# Patient Record
Sex: Male | Born: 1950 | Race: White | Hispanic: No | Smoking: Former smoker
Health system: Southern US, Community
[De-identification: ages and names within clinical notes are randomized; demographics above are authoritative.]

## PROBLEM LIST (undated history)

## (undated) DIAGNOSIS — K222 Esophageal obstruction: Secondary | ICD-10-CM

## (undated) DIAGNOSIS — D494 Neoplasm of unspecified behavior of bladder: Secondary | ICD-10-CM

## (undated) DIAGNOSIS — I639 Cerebral infarction, unspecified: Secondary | ICD-10-CM

## (undated) DIAGNOSIS — R1310 Dysphagia, unspecified: Secondary | ICD-10-CM

## (undated) DIAGNOSIS — I10 Essential (primary) hypertension: Secondary | ICD-10-CM

## (undated) DIAGNOSIS — E119 Type 2 diabetes mellitus without complications: Secondary | ICD-10-CM

## (undated) DIAGNOSIS — K635 Polyp of colon: Secondary | ICD-10-CM

## (undated) DIAGNOSIS — K219 Gastro-esophageal reflux disease without esophagitis: Secondary | ICD-10-CM

## (undated) MED FILL — Lidocaine HCl Urethral/Mucosal Gel Prefilled Syringe 2%: CUTANEOUS | Qty: 1 | Status: AC

---
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
## 2007-03-15 DIAGNOSIS — I639 Cerebral infarction, unspecified: Secondary | ICD-10-CM

## 2007-03-15 HISTORY — DX: Cerebral infarction, unspecified: I63.9

## 2007-08-26 ENCOUNTER — Other Ambulatory Visit: Payer: Self-pay

## 2007-08-26 ENCOUNTER — Emergency Department: Payer: Self-pay | Admitting: Emergency Medicine

## 2007-09-17 ENCOUNTER — Other Ambulatory Visit: Payer: Self-pay

## 2007-09-17 ENCOUNTER — Inpatient Hospital Stay: Payer: Self-pay | Admitting: *Deleted

## 2007-09-21 ENCOUNTER — Ambulatory Visit: Payer: Self-pay | Admitting: Physical Medicine & Rehabilitation

## 2007-09-21 ENCOUNTER — Inpatient Hospital Stay (HOSPITAL_COMMUNITY)
Admission: RE | Admit: 2007-09-21 | Discharge: 2007-09-29 | Payer: Self-pay | Admitting: Physical Medicine & Rehabilitation

## 2007-10-02 ENCOUNTER — Encounter: Payer: Self-pay | Admitting: Physical Medicine & Rehabilitation

## 2007-10-13 ENCOUNTER — Encounter: Payer: Self-pay | Admitting: Physical Medicine & Rehabilitation

## 2007-10-18 ENCOUNTER — Inpatient Hospital Stay: Payer: Self-pay | Admitting: Internal Medicine

## 2007-11-08 ENCOUNTER — Encounter
Admission: RE | Admit: 2007-11-08 | Discharge: 2007-11-09 | Payer: Self-pay | Admitting: Physical Medicine & Rehabilitation

## 2007-11-08 ENCOUNTER — Ambulatory Visit: Payer: Self-pay | Admitting: Gastroenterology

## 2007-11-09 ENCOUNTER — Ambulatory Visit: Payer: Self-pay | Admitting: Physical Medicine & Rehabilitation

## 2007-12-06 ENCOUNTER — Emergency Department: Payer: Self-pay | Admitting: Emergency Medicine

## 2007-12-20 ENCOUNTER — Ambulatory Visit: Payer: Self-pay | Admitting: Gastroenterology

## 2009-07-05 IMAGING — US US EXTREM LOW VENOUS BILAT
1 series · 18 of 24 positions shown · non-contrast
Comparison: none

REASON FOR EXAM: leg swelling, occasional pain
COMMENTS:

PROCEDURE:     US  - US DOPPLER LOW EXTR BILATERAL  - December 06, 2007  [DATE]
RESULT:     Bilateral color flow Duplex Doppler reveals no evidence of deep
venous thrombosis.  Color flow analysis, Doppler analysis and compression
studies are normal.

[Series 1: us extrem low venous bilat · 18 of 38 slices shown]
[im 1/38]
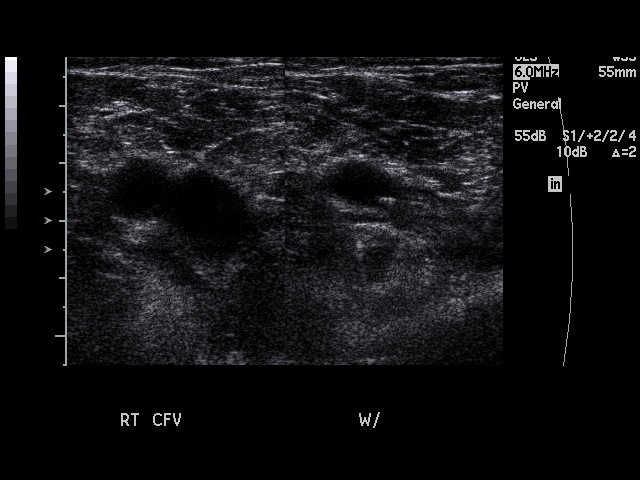
[im 4/38]
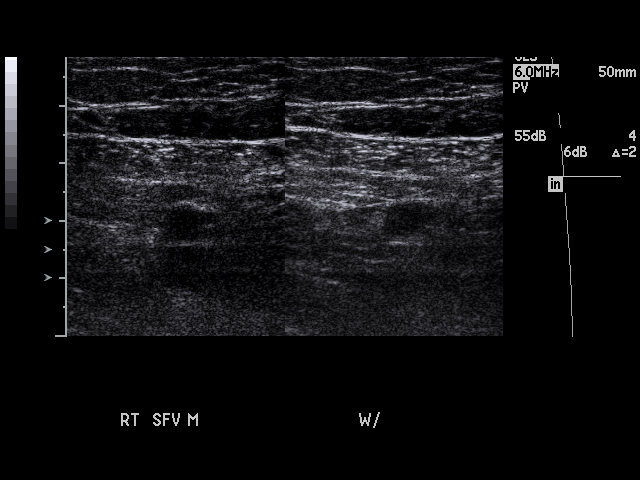
[im 5/38]
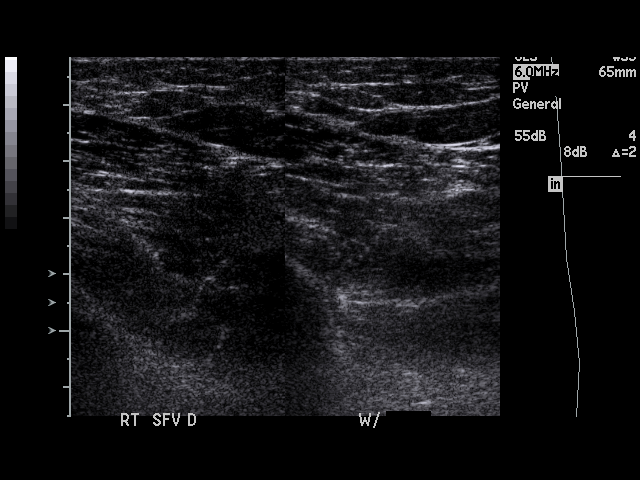
[im 7/38]
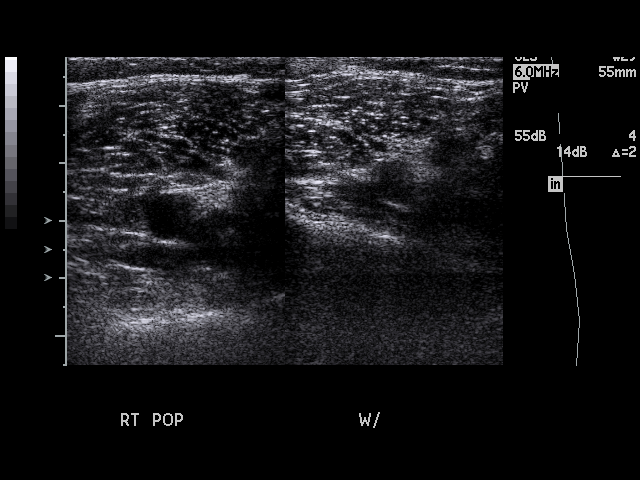
[im 10/38]
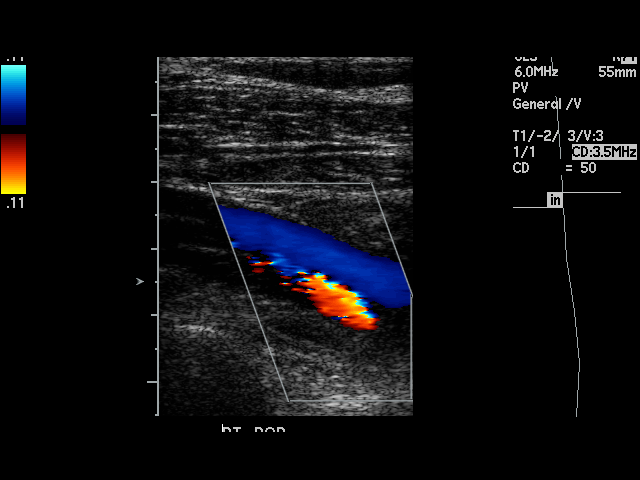
[im 12/38]
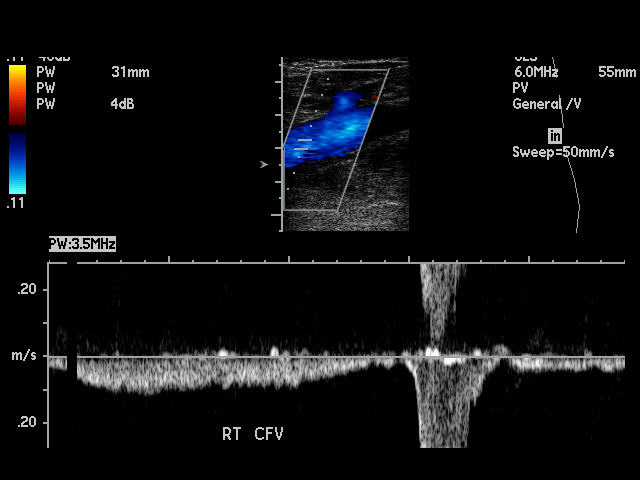
[im 13/38]
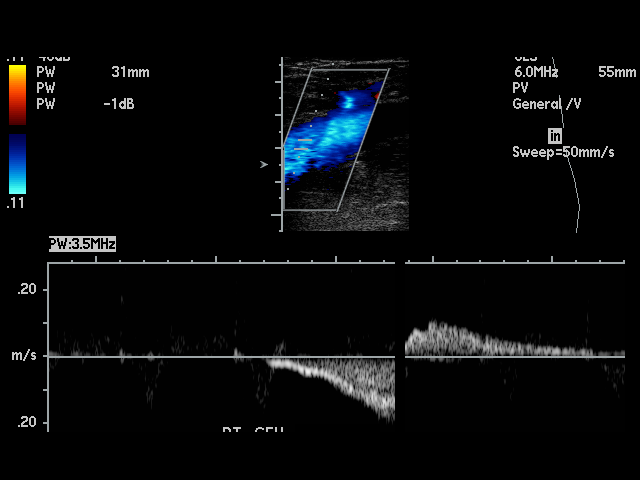
[im 17/38]
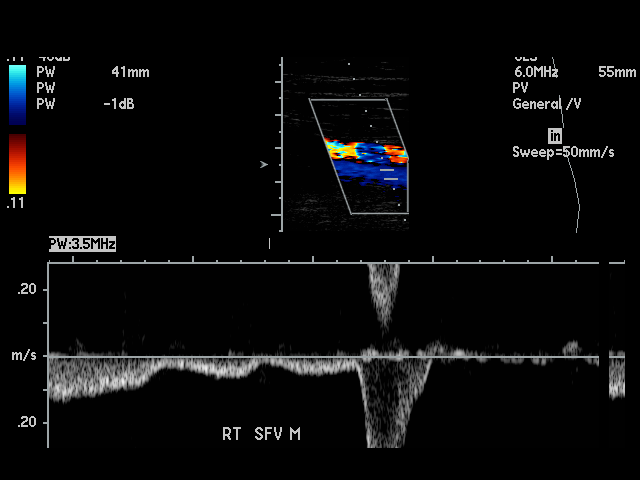
[im 18/38]
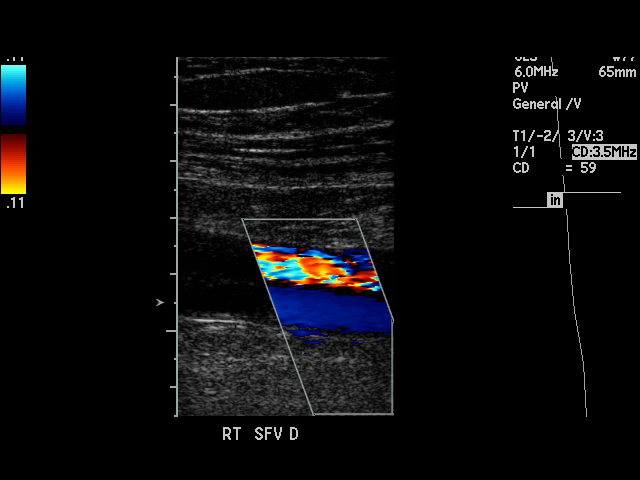
[im 20/38]
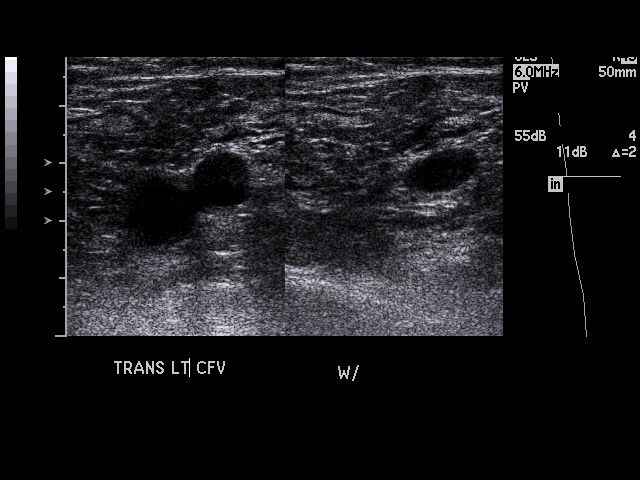
[im 23/38]
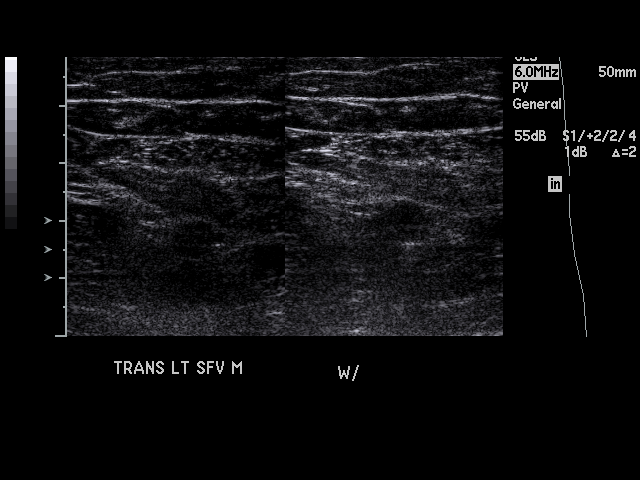
[im 25/38]
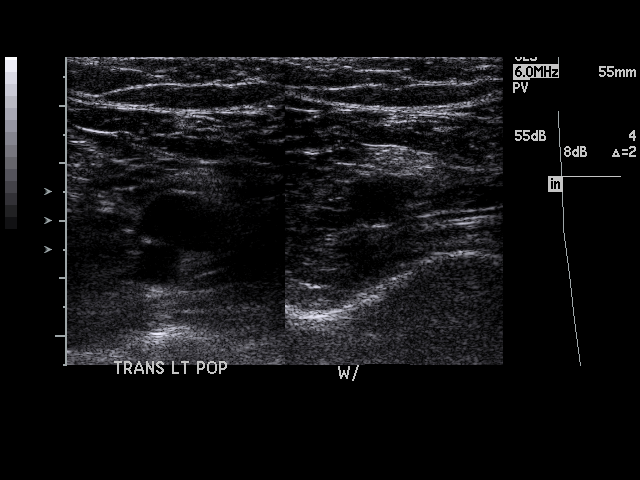
[im 26/38]
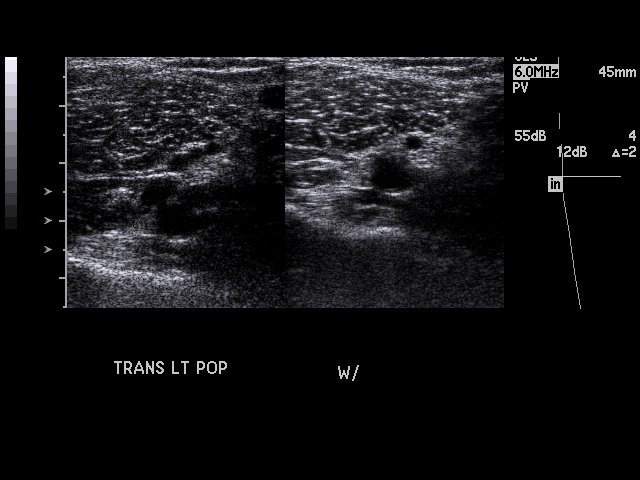
[im 29/38]
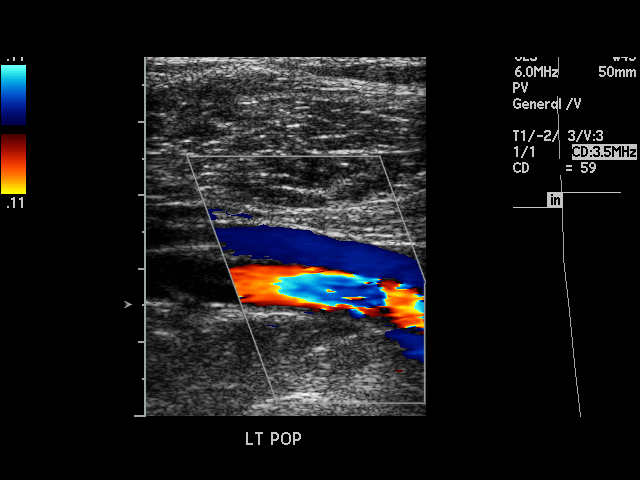
[im 31/38]
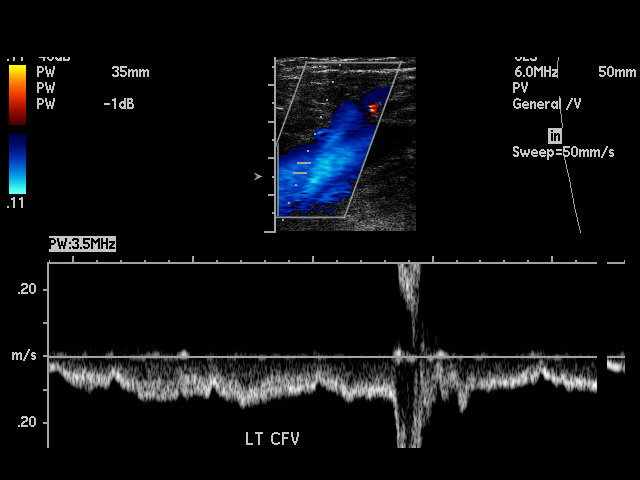
[im 33/38]
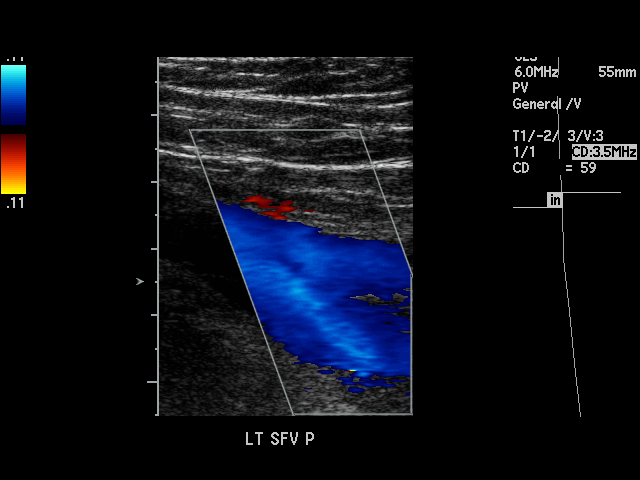
[im 36/38]
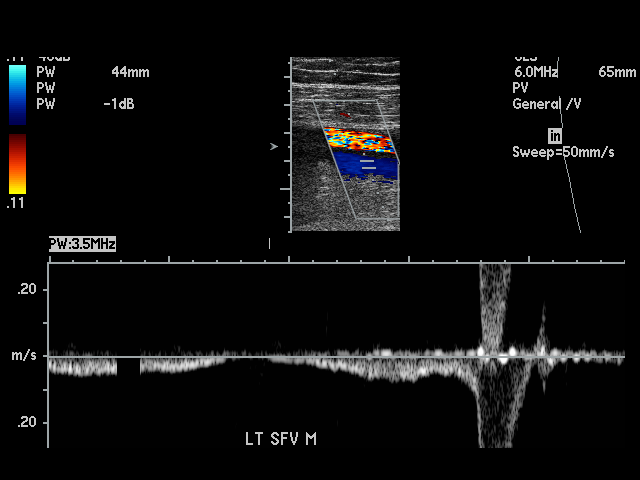
[im 38/38]
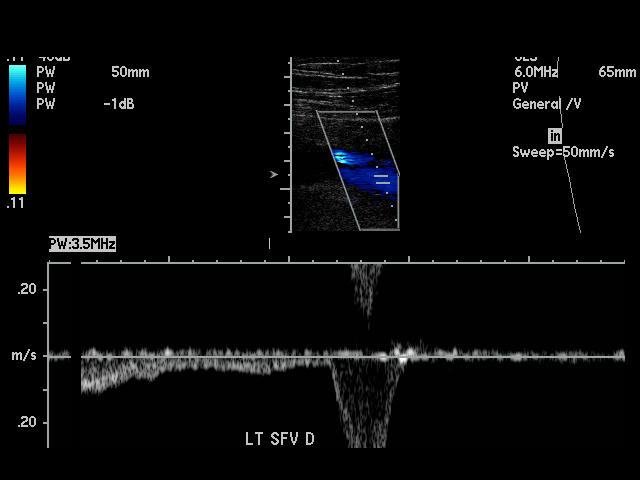

[18 of 24 positions shown; findings below may reference images not displayed]

IMPRESSION: No acute or focal abnormality is identified.

## 2009-07-05 IMAGING — CR DG CHEST 1V PORT
1 series · 1 of 1 positions shown · non-contrast
Comparison: none

REASON FOR EXAM: leg swelling
COMMENTS:

PROCEDURE:     DXR - DXR PORTABLE CHEST SINGLE VIEW  - December 06, 2007  [DATE]
RESULT:     Cardiomegaly is noted. Mild interstitial prominence is noted.
Interstitial changes are stable from 09/17/2007.  Interstitial fibrosis
cannot be excluded.

[view not recorded]
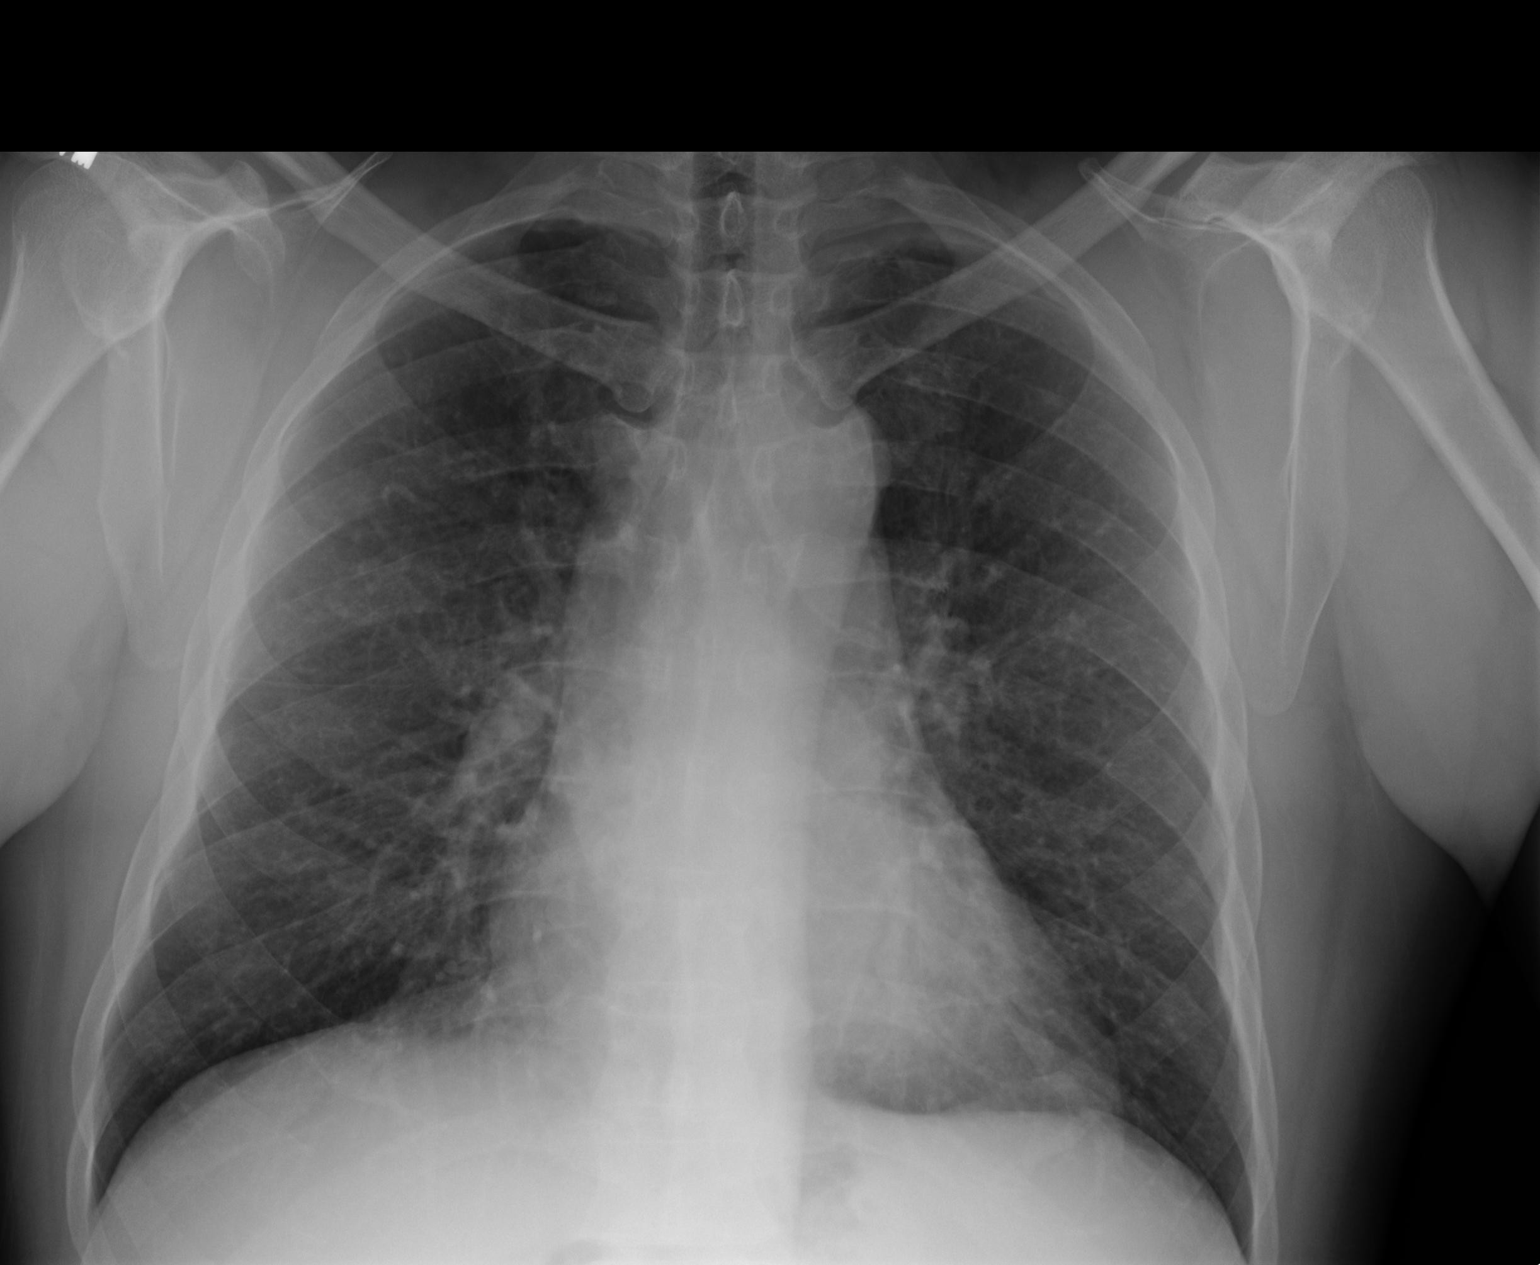

[1 of 1 positions shown; findings below may reference images not displayed]

IMPRESSION: Stable pulmonary interstitial prominence, no change from
09/17/2007.  Mild interstitial fibrosis cannot be excluded.

## 2010-01-03 ENCOUNTER — Emergency Department: Payer: Self-pay | Admitting: Emergency Medicine

## 2010-03-14 DIAGNOSIS — E785 Hyperlipidemia, unspecified: Secondary | ICD-10-CM

## 2010-03-14 HISTORY — DX: Hyperlipidemia, unspecified: E78.5

## 2010-07-09 DIAGNOSIS — I679 Cerebrovascular disease, unspecified: Secondary | ICD-10-CM

## 2010-07-09 DIAGNOSIS — E785 Hyperlipidemia, unspecified: Secondary | ICD-10-CM | POA: Insufficient documentation

## 2010-07-09 HISTORY — DX: Cerebrovascular disease, unspecified: I67.9

## 2010-07-27 NOTE — Assessment & Plan Note (Signed)
[name] returns to the clinic today for followup evaluation.  He is a  60 year old Caucasian male with a history of dizziness since September 15, 2007.  He also experienced nausea along with right-sided weakness and  numbness.  He was admitted to [facility] on September 20, 2007, for  workup revealing lacunar infarcts within the cerebellar vermis and few  subacute infarct in the right lobe of the cerebellum with focal areas of  acute infarction.  Carotid Dopplers showed mild calcified plaque.  There  was no internal carotid artery stenosis.  CT of the neck showed  degenerative changes of the cervical spine with severe disk disease.  There was a normal vertebral basilar system with a dominant left  vertebral artery.  The patient was evaluated by Neurology and they  recommend Coumadin x6 months' duration along with the aspirin and then  reevaluation at 6 months' duration.   The patient eventually stabilized.  He was moved to the rehabilitation  unit at [facility] on September 21, 2007, remained there through  discharge on September 28, 2007.  On discharge, we had him taking Coumadin 2  mg to be taken 1-1/2 tablet p.m. the Saturday that he discharged and  then hold it on Sunday with followup blood work on Monday.  He  apparently started seeing a physician early that week and apparently  they increased his dose of Coumadin up to 5 mg daily.  They apparently  did not check any blood work and the patient had significantly elevated  INR of 15.0 several days later when he eventually was seen in the  emergency department with stomachache pains.  He was found to have  bleeding in his abdomen and underwent endoscopy along with possible  barium swallow and colonoscopy.  Those have all stabilized and he  continues to take probiotics at the present time.  They were still  planning a future colonoscopy as the study that they had attempted could  not be completed at that time.  The patient is taken off  Coumadin  completely at this time but continues on aspirin therapy daily.   The patient would like to wean off some of his other medicines that he  is on at present.  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   MEDICATIONS:  1. Celexa 10 mg.  2. Aspirin 81 mg.  3. Zocor 40 mg.  4. Trazodone 50 mg nightly.  5. Protonix 40 mg daily.   REVIEW OF SYSTEMS:  Noncontributory.   PHYSICAL EXAMINATION:  GENERAL:  Well-appearing fit adult male in mild-  to-no acute discomfort.  VITAL SIGNS:  Blood pressure 168/91 with pulse 78, respiratory rate 18,  and O2 saturation 98% on room air.  EXTREMITIES:  The patient has 5-/5 strength throughout the bilateral  upper and lower extremities.  Bulk and tone were normal, reflexes were  2+ and symmetrical.  The patient ambulates without any assistive device.   IMPRESSION:  1. Right occipital ischemic stroke with ataxia, balance deficits,      dizziness, and gaze instability.  2. Dyslipidemia.  3. Anxiety with situational adjustment reaction.  4. Borderline hypertension.   In the office today, we did discuss the problems that he had with his  primary care physician and the severely elevated INR on high doses of  Coumadin.  He was on a much lower dose in the hospital and was  discharged  on a much lower dose.  Unfortunately, he did not have regular  blood work done once the dose was adjusted as an outpatient.  At this  point, the patient is off Coumadin but remains on aspirin therapy.  He  has improved and has actually completed all therapy at this point.  He  is anxious to return to his work and return to driving.  We have given  him a slip that he can do at as of November 12, 2007.  In terms of his  medicine, we have made the trazodone nightly p.r.n. and asked him to  wean the Celexa to every other day for 1 week and then discontinue  completely.  He will remain on aspirin, Zocor, and  Protonix medications  with his new primary care to make further adjustments there.  He would  like to come off the cholesterol medication and will need blood work to  verify that his levels were under control.  It does not appear that any  blood work was done for lipid levels in the hospital.   We will plan on seeing the patient in followup on an as-needed basis,  and he will be following up with his primary care physician.           ______________________________  Ellwood Dense, M.D.     DC/MedQ  D:  11/09/2007 13:15:45  T:  11/10/2007 03:36:04  Job #:  [id number]

## 2010-07-27 NOTE — Discharge Summary (Signed)
NAMEAMARE, BAIL NO.:  [id number]   MEDICAL RECORD NO.:  [id number]          PATIENT TYPE:  IPS   LOCATION:  4033                         FACILITY:  [facility]   PHYSICIAN:  Ellwood Dense, M.D.   DATE OF BIRTH:  Dec 07, 1950   DATE OF ADMISSION:  09/21/2007  DATE OF DISCHARGE:  09/28/2007                               DISCHARGE SUMMARY   DISCHARGE DIAGNOSES:  1. Right occipital cerebrovascular accident with ataxia, balance      deficits, dizziness, and gaze instability.  2. Hypertension.  3. Dyslipidemia.  4. Anxiety with situational adjustment reaction.   HISTORY OF PRESENT ILLNESS:  Caleb Davies is a 60 year old male with a  history of dizziness since September 15, 2007, as well as nausea with loss of  balance and right-sided numbness.  He was admitted to [facility] on September 20, 2007, with workup revealing lacunar infarcts within  cerebellar vermis and diffuse subacute infarct in right lobe of  cerebellum with focal areas of acute infarct.  Carotid Doppler showed  mild calcific plaque.  No ICA stenosis.  CT of neck showed degenerative  changes of C-spine with severe disk disease.  Normal vertebrobasilar  system with dominant left vertebral artery.  Neuro evaluated the patient  and recommended Coumadin x6 months with aspirin as the patient with  history of dizziness for 3 weeks and recurrent strokes.  Therapies were  initiated and the patient is requiring assist for ADLs and requiring  balance issues with impairments in mobility.  MD and therapist felt the  patient would benefit from CIR and the patient was transferred to Rehab  at [facility].   PAST MEDICAL HISTORY:  Significant for hypertension, which is a recent  diagnosis, dyslipidemia, and dizziness on and off since August 26, 2007.   ALLERGIES:  CODEINE.   FAMILY HISTORY:  Positive for coronary artery disease and CVA.   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  He has a 2-pack-per-day tobacco  use history of 40 years.  Drinks an alcoholic drink a month.  Family is  to provide supervision past discharge.   HOSPITAL COURSE:  [name] was admitted to Rehab on September 21, 2007, for inpatient therapies to consist of PT/OT daily as well as  speech therapy evaluation.  The patient underwent at least 3 hours of  therapy 5 days a week during his stay here.  Nursing has been working  with the patient regarding bowel and bladder issues as well as routine  skin care monitoring for check of breakdown.  The patient reported  elevated anxiety levels with multiple concerns due to family financial  issues at the time of admission.  He was started on Celexa 10 mg p.o.  per day for this.  Dr. Leonides Cave, Neuropsych, was also consulted for input  and followup for coping.  He felt the patient with adjustment reaction  and he has followed the  patient along during this stay.  At the time of  discharge, the patient's mood was again discussed with him and Celexa  dosage was reexamined.  The patient currently feels like his anxiety  levels are much improved and we will prefer to leave Celexa at 10 mg a  day.  The patient's blood pressures have been checked on a b.i.d. basis  during this stay.  Low-dose Altace was added secondary to a history of  stroke.  However, the patient has been unable to tolerate this.  His  blood pressure is dropping down in 90s to low 100 range.  Altace has  been on hold during the stay.  Blood pressures at the time of discharge  ranging from 100-110 systolic and 60s diastolic.  The patient has had  issues with dizziness initially.  He was started on meclizine 25 mg  q.i.d. that has helped with his symptoms.  PT also completed a  vestibular evaluation that revealed that the patient had gaze  instability with no oculomotor  deficits.  Gaze stabilization exercises  as well as visual convergence exercises were given to the patient and he  is to continue doing this as well as somatosensory training to help with  his symptomatology.  Pharmacy has been following along and adjusting the  patient's Coumadin during this stay.  The patient's PT/INR at the time  of discharge on September 28, 2007, reveals PT at 33.9 and INR at 3.1.  The  patient's INR has been on an upward trend.  He is discharged on 1 mg  alternating with two every other day for now.  He will have a repeat  protime checked at Dr. Chancy Milroy office on October 01, 2007, and Dr.  Rito Ehrlich to further adjust Coumadin dose as needed.   Labs were done past admission including CBC revealing hemoglobin 12.3,  hematocrit 35.2, white count 5.98, and platelets 195.  Check of lytes  revealed sodium 138, potassium 3.8, chloride 105, CO2 25, BUN 9,  creatinine 0.9, and glucose 109.  LFTs revealed T-bili 1.2, alkaline  phos 54, SGOT 14, SGPT 21, total protein 5.8, and albumin 3.1.  The  patient is maintained on Zocor for his dyslipidemia.  He will need a  recheck of LFTs in 3 months.  Hemoglobin A1c was also checked and this  was noted to be at 5.5 range.  The patient shows some evidence of  impaired blood sugars with fasting blood sugars ranging at 100-120  range.  He will need followup on his blood sugars past discharge.  The  patient does have a history of tobacco abuse.  He reports that he does  not require any assistance in tobacco cessation.  He plans on quitting  smoking.  The patient's mood during this stay has been stable overall.  He has been motivated and has participated along well.  In PT, the  patient was initially at max assist due to balance deficits, but has  progressed along to being at modified independent in a supervised  setting with the use of rolling walker.  He requires min assist to  ambulate without rolling walker.  OT has been working with the  patient  with ADL functioning on transfers, visual movements, and head movements  with sitting and standing.  Initially, the patient requires steady  assist for balance with min-to-mod assist for self care.  The patient  has progressed along to being at supervision to modified independent  goal for self care.  Speech Therapy  evaluated the patient and felt the  patient with intact receptive expressive language skills with some  decreased breath support due to his history of tobacco abuse.  He was  modified independent for higher-level functional cognitive test  additionally.  No speech therapy needs were warranted at this stay.  The  patient will continue with further followup outpatient PT/OT at [facility] beginning on October 02, 2007.   DISCHARGE MEDICATIONS:  1. Coated aspirin 81 mg a day.  2. Zocor 40 mg in the evening.  3. Dulcolax tabs 2 pills per day at bedtime.  4. Celexa 10 mg a day.  5. Trazodone 50 mg nightly.  6. Antivert 25 mg at 7 a.m., noon, 4 p.m., and 10 p.m.  7. Coumadin 2 mg half  p.o. in p.m. on Saturday, hold p.o. in p.m.      Sunday, further dosing per Dr. Rito Ehrlich.  8. Protonix 40 mg a day.  9. Milk of magnesia 30 mL p.r.n. if no BM in 2-3 days.   DIET:  Heart healthy, high fiber.   ACTIVITY:  Intermittent supervision.  No strenuous activity.  No  alcohol, no smoking, and no driving.  Walk with supervision.   FOLLOWUP:  The patient is to follow up with Dr. Thomasena Edis in 6 weeks.  Follow up with Dr. Rito Ehrlich on Monday at 7:20 to have labs drawn.  Follow up with Neurology and Dr. Elenore Rota as needed.      Greg Cutter, P.A.    ______________________________  Ellwood Dense, M.D.    PP/MEDQ  D:  09/28/2007  T:  09/29/2007  Job:  [id number]   cc:   Dr. Rito Ehrlich

## 2010-07-27 NOTE — H&P (Signed)
NAMEJAYVAN, MCSHAN NO.:  [id number]   MEDICAL RECORD NO.:  [id number]          PATIENT TYPE:  IPS   LOCATION:  4033                         FACILITY:  [facility]   PHYSICIAN:  Ellwood Dense, M.D.   DATE OF BIRTH:  05/04/1950   DATE OF ADMISSION:  09/21/2007  DATE OF DISCHARGE:                              HISTORY & PHYSICAL   PRIMARY CARE PHYSICIAN:  Steele Sizer, MD   HOSPITALIST:  [facility], Dr. Kyra Manges.   HISTORY OF PRESENT ILLNESS:  Mr. Pelaez is a 60 year old Caucasian male  with history of dizziness since approximately September 15, 2007.  He has also  had nausea and loss of balance with right-sided numbness noted.   The patient initially had symptoms back in June, and that was evaluated  and felt to be related to vertigo and possible inner ear problems.  He  is given meclizine and Phenergan, but did not have resolution of his  symptoms, and actually they worsened in early July.   The patient was admitted to [facility] on September 20, 2007,  for workup.  MRI study of the brain showed a lacunar infarct within the  cerebellar vermis with diffuse subacute infarcts within the right lobe  at the cerebellum with focal areas of acute infarct.  Carotid Doppler  showed mild calcified plaque of the internal carotid artery system.  CT  angiography of the neck showed degenerative changes in the cervical  spine with severe degenerative disk disease with normal vertebral-  basilar system with dominant left vertebral artery.   Neurology was consulted for recommendations, and they suggested Coumadin  for 6 months.  At that time, he would be reevaluated and if he has  stroke symptoms, he would be continued on Coumadin.  If he had no stroke  symptoms, they recommended switching him to aspirin at that point.   Therapies have been initiated, and the patient requires assist for ADLs  and mobility with balance issues.   A 2-D echocardiogram showed an  ejection fraction of greater than equal  to 55% without any source of emboli noted.   The patient was evaluated by the rehabilitation physicians, and felt to  be an appropriate candidate for inpatient rehabilitation.   REVIEW OF SYSTEMS:  Positive for high levels of stress related to his  job, dizziness with hiccups since initial stroke symptoms.   PAST MEDICAL HISTORY:  1. History of hypertension with recurrent diagnoses of such.  2. Dyslipidemia.  3. History of dizziness off and on since August 26, 2007.   FAMILY HISTORY:  Positive for coronary artery disease and stroke.   SOCIAL HISTORY:  The patient has his youngest daughter living with him  in a single white trailer with 3 steps to enter.  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  He  has 4 biological daughters and 2 biological sons with 2 females, who he  is both divorced from.  He has good relationship with his most  recent  wife, who is in the room with him today.  The patient smokes 2 pack of  cigarettes per day for 40 years and reports an alcoholic drink  approximately monthly.   FUNCTIONAL HISTORY PRIOR TO ADMISSION:  Independent, working, and  driving prior to admission.   ALLERGIES:  CODEINE.   MEDICATIONS PRIOR TO ADMISSION:  1. Recent blood pressure medication.  2. Phenergan p.r.n.  3. Meclizine p.r.n.   LABORATORY DATA:  Recent hemoglobin was 14.6 with hematocrit of 41.6,  platelet count of 292,000, and white count of 8.2.  Recent total  cholesterol was 222 with HDL cholesterol of 29, LDL cholesterol of 172,  and triglycerides of 105.  Chest x-ray on September 17, 2007, showed clear  lung fields without any active disease.  INR today was 1.0 with  hemoglobin repeat of 11.9.  Most recent sodium was 139, potassium 3.9,  chloride 104, bicarbonate 26, BUN 11, creatinine 1.14 with glucose of  149, and  recent liver function tests were within functional limits.   PHYSICAL EXAMINATION:  GENERAL:  Well-appearing, well-developed, and  well-nourished adult male, lying in bed, in no acute discomfort.  VITAL SIGNS:  Blood pressure 141/74 with pulse of 72, respiratory rate  20, and temperature 97.2.  HEENT:  Normocephalic and nonatraumatic.  CARDIOVASCULAR:  Regular rate and rhythm.  S1 and S2 without murmurs.  ABDOMEN:  Soft and nontender with positive bowel sounds.  LUNGS:  Clear to auscultation bilaterally.  NEUROLOGIC:  Alert and oriented x3.  Cranial nerves II through XII were  intact.  EXTREMITIES:  The patient has 5/5 strengths throughout bilateral upper  extremities with slight decrease coordinated movements.  Lower extremity  exam showed 5/5 strength in hip flexion, knee extension, and ankle  dorsiflexion.  Sensation was intact to light touch with bilateral lower  extremities.  The patient has slightly discoordinated heel-to-shin  movements bilaterally.   DIAGNOSES:  1. Status post right occipital stroke with ataxia, balance, dizziness,      and hiccups.  2. Presently, the patient has deficits in ADLs, transfers, and      ambulation related to the above-noted right occipital stroke.   PLAN:  1. Admit to the rehabilitation unit for daily therapies to include      physical therapy for range of motion, strengthening, bed mobility,      transfers, pre-gait training, gait training, and equipment      evaluation.  2. Occupational therapy for range of motion, strengthening, ADLs,      cognitive/perceptual training, splinting, and equipment evaluation.  3. Rehab nursing for skin care, wound care, and bowel and bladder      training if necessary.  4. Case management to assess home environment, assist with discharge      planning, and arrange for appropriate followup care.  5. Social work to assess family and social support and assist in      discharge planning.  6. Speech therapy to  evaluate higher level cognition and swallow if      necessary.  7. Monitor of hypertension with adjustment of medicines as needed.  8. Continue Zocor for dyslipidemia.  9. Check admission labs including CBC and CMET in a.m. on September 24, 2007.  10.Regular diet.  11.Enteric-coated aspirin 81 mg p.o. daily.  12.Zocor 40 mg p.o. daily.  13.Protonix 40 mg p.o. daily.  14.Coumadin, per pharmacy.  15.Meclizine 25 mg p.o. q.i.d. p.r.n. for dizziness.  16.Baclofen 5 mg p.o. t.i.d. for  hiccups.  17.Senokot-S 2 tablets p.o. nightly.  18.Dulcolax suppository 1 per rectum today if no bowel movement by 4      p.m.  19.Discontinue normal saline lock in the right upper extremity.  20.Celexa 10 mg p.o. daily.  21.One-on-one counseling with Dr. Leonides Cave for coping and stress relief      on September 25, 2007.  22.Altace 2.5 mg p.o. daily.   Presently, the plan is to continue therapies as noted above with weekly  conference meetings and other meetings as necessary to address the  present status, goals, and barriers to discharge.   PROGNOSIS:  Good.   ESTIMATED LENGTH OF STAY:  5-10 days.   GOALS:  Modified independent, ADLs, transfers, and ambulation at least  household distances.           ______________________________  Ellwood Dense, M.D.     DC/MEDQ  D:  09/21/2007  T:  09/22/2007  Job:  [id number]

## 2010-12-09 LAB — HEMOGLOBIN A1C
Hgb A1c MFr Bld: 5.5
Mean Plasma Glucose: 119

## 2010-12-09 LAB — COMPREHENSIVE METABOLIC PANEL
Albumin: 3.1 — ABNORMAL LOW
Alkaline Phosphatase: 54
BUN: 9
CO2: 25
Chloride: 105
Creatinine, Ser: 0.98
GFR calc non Af Amer: >60
Glucose, Bld: 109 — ABNORMAL HIGH
Potassium: 3.8
Total Bilirubin: 1.2

## 2010-12-09 LAB — CBC
HCT: 35.2 — ABNORMAL LOW
Hemoglobin: 12.3 — ABNORMAL LOW
MCV: 93.6
Platelets: 195
WBC: 5.9

## 2010-12-09 LAB — DIFFERENTIAL
Basophils Absolute: 0
Basophils Relative: 0
Lymphocytes Relative: 26
Monocytes Absolute: 0.5
Neutro Abs: 3.4
Neutrophils Relative %: 58

## 2010-12-09 LAB — URINALYSIS, ROUTINE W REFLEX MICROSCOPIC
Glucose, UA: NEGATIVE
Nitrite: NEGATIVE
Protein, ur: NEGATIVE
pH: 7

## 2010-12-09 LAB — PROTIME-INR
INR: 1.1
INR: 1.5
INR: 2.7 — ABNORMAL HIGH
INR: 3 — ABNORMAL HIGH
Prothrombin Time: 33.5 — ABNORMAL HIGH

## 2010-12-09 LAB — URINE CULTURE

## 2010-12-10 LAB — PROTIME-INR
INR: 2.3 — ABNORMAL HIGH
INR: 3 — ABNORMAL HIGH
INR: 3.1 — ABNORMAL HIGH
Prothrombin Time: 33.5 — ABNORMAL HIGH
Prothrombin Time: 33.9 — ABNORMAL HIGH

## 2011-04-12 DIAGNOSIS — I10 Essential (primary) hypertension: Secondary | ICD-10-CM | POA: Insufficient documentation

## 2011-04-12 DIAGNOSIS — E119 Type 2 diabetes mellitus without complications: Secondary | ICD-10-CM | POA: Insufficient documentation

## 2012-03-14 DIAGNOSIS — N529 Male erectile dysfunction, unspecified: Secondary | ICD-10-CM

## 2012-03-14 HISTORY — DX: Male erectile dysfunction, unspecified: N52.9

## 2012-03-14 HISTORY — PX: ANTERIOR CERVICAL DECOMP/DISCECTOMY FUSION: SHX1161

## 2012-08-27 DIAGNOSIS — Z0000 Encounter for general adult medical examination without abnormal findings: Secondary | ICD-10-CM | POA: Insufficient documentation

## 2012-08-27 DIAGNOSIS — N529 Male erectile dysfunction, unspecified: Secondary | ICD-10-CM | POA: Insufficient documentation

## 2012-08-27 DIAGNOSIS — K219 Gastro-esophageal reflux disease without esophagitis: Secondary | ICD-10-CM | POA: Insufficient documentation

## 2012-10-16 DIAGNOSIS — M25519 Pain in unspecified shoulder: Secondary | ICD-10-CM | POA: Insufficient documentation

## 2012-10-31 DIAGNOSIS — M5020 Other cervical disc displacement, unspecified cervical region: Secondary | ICD-10-CM

## 2012-10-31 HISTORY — DX: Other cervical disc displacement, unspecified cervical region: M50.20

## 2013-08-16 LAB — CBC WITH DIFFERENTIAL/PLATELET
BASOS PCT: 0.8 %
Basophil #: 0 10*3/uL (ref 0.0–0.1)
EOS PCT: 4.2 %
Eosinophil #: 0.2 10*3/uL (ref 0.0–0.7)
HCT: 37.5 % — AB (ref 40.0–52.0)
HGB: 13 g/dL (ref 13.0–18.0)
LYMPHS ABS: 1.4 10*3/uL (ref 1.0–3.6)
Lymphocyte %: 25.1 %
MCH: 31.5 pg (ref 26.0–34.0)
MCHC: 34.6 g/dL (ref 32.0–36.0)
MCV: 91 fL (ref 80–100)
Monocyte #: 0.6 x10 3/mm (ref 0.2–1.0)
Monocyte %: 9.9 %
NEUTROS PCT: 60 %
Neutrophil #: 3.3 10*3/uL (ref 1.4–6.5)
PLATELETS: 206 10*3/uL (ref 150–440)
RBC: 4.13 10*6/uL — ABNORMAL LOW (ref 4.40–5.90)
RDW: 14.1 % (ref 11.5–14.5)
WBC: 5.5 10*3/uL (ref 3.8–10.6)

## 2013-08-16 LAB — COMPREHENSIVE METABOLIC PANEL
ALBUMIN: 3.5 g/dL (ref 3.4–5.0)
ALT: 28 U/L (ref 12–78)
Alkaline Phosphatase: 103 U/L
Anion Gap: 1 — ABNORMAL LOW (ref 7–16)
BUN: 10 mg/dL (ref 7–18)
Bilirubin,Total: 2.1 mg/dL — ABNORMAL HIGH (ref 0.2–1.0)
CO2: 30 mmol/L (ref 21–32)
CREATININE: 1.26 mg/dL (ref 0.60–1.30)
Calcium, Total: 9 mg/dL (ref 8.5–10.1)
Chloride: 109 mmol/L — ABNORMAL HIGH (ref 98–107)
EGFR (African American): >60
EGFR (Non-African Amer.): >60
GLUCOSE: 99 mg/dL (ref 65–99)
Osmolality: 278 (ref 275–301)
POTASSIUM: 3.8 mmol/L (ref 3.5–5.1)
SGOT(AST): 29 U/L (ref 15–37)
SODIUM: 140 mmol/L (ref 136–145)
Total Protein: 7.3 g/dL (ref 6.4–8.2)

## 2013-08-17 ENCOUNTER — Observation Stay: Payer: Self-pay | Admitting: Student

## 2013-09-11 HISTORY — PX: ESOPHAGOGASTRODUODENOSCOPY: SHX1529

## 2013-09-11 HISTORY — PX: COLONOSCOPY: SHX174

## 2013-10-03 ENCOUNTER — Ambulatory Visit: Payer: Self-pay | Admitting: Gastroenterology

## 2014-07-05 NOTE — H&P (Signed)
PATIENT NAME:  Caleb Davies, Caleb Davies MR#:  [id number] DATE OF BIRTH:  07-May-1950  DATE OF ADMISSION:  08/17/2013  REFERRING PHYSICIAN:  Dr. Marjean Donna.  PRIMARY CARE PHYSICIAN:  Dr. Rudene Christians of [facility].   CHIEF COMPLAINT:  "Something stuck in my throat."   HISTORY OF PRESENT ILLNESS:  A 64 year old Caucasian gentleman with past medical history of hypertension, hyperlipidemia as well as CVA without residual deficit presenting with esophageal foreign body sensation.  He was eating one day prior to arrival steak, had immediate foreign body stuck sensation located in the retrosternal and around the lower esophageal sphincter.  Denies any chest pain or shortness of breath.  He was able to tolerate liquids as well as his secretions.  No difficulty talking.  He was, however, unable to tolerate any solid foods.  Thus presented to the hospital for the above symptoms.  They have remained unchanged since the initial onset approximately one day ago.  On initial work-up found to have a 3 x 2 cm foreign body lesion in the lower esophagus.   REVIEW OF SYSTEMS:   CONSTITUTIONAL:  Denies fever, fatigue, weakness.  EYES:  Denies blurred vision, double vision, eye pain. EAR, NOSE, THROAT:  Denies tinnitus, ear pain, hearing loss. RESPIRATORY:  Denies cough, wheeze, shortness of breath. CARDIOVASCULAR:  Denies chest pain, palpitations, edema. GASTROINTESTINAL:  Denies nausea, vomiting, diarrhea, abdominal pain.  GENITOURINARY:  Denies dysuria, hematuria. ENDOCRINE:  Denies nocturia or thyroid problems. HEMNATOLOGY AND LYMPHATIC:  Denies easy bruising or bleeding. SKIN:  Denies rashes or lesions.  MUSCULOSKELETAL:  Denies pain in the neck, back, shoulder, knees, hips or arthritic symptoms.  NEUROLOGIC:  Denies paralysis, paresthesias. PSYCHIATRIC:  Denies anxiety or depressive symptoms.  Otherwise, full review of systems performed by me is negative.   PAST MEDICAL HISTORY:  Gastroesophageal reflux disease,  history of CVA, hypertension, hyperlipidemia.   SOCIAL HISTORY:  Remote tobacco usage, occasional smokeless tobacco.  Occasional alcohol usage.   FAMILY HISTORY:  Denies any known cardiovascular or pulmonary disorders.   ALLERGIES:  CODEINE.   HOME MEDICATIONS:  Include aspirin 81 mg by mouth daily, atorvastatin unknown dosage daily, Prevacid 30 mg by mouth daily as well as an unknown hypertensive medication.   PHYSICAL EXAMINATION:  VITAL SIGNS:  Temperature 98.1, heart rate 84, respirations 18, blood pressure 161/90, saturating 93% on room air.  Weight 95.3 kg, BMI of 31.   GENERAL:  Well-nourished, well-developed, Caucasian male, currently in no acute distress. HEAD:  Normocephalic, atraumatic.   EYES:  Pupils equal, round, reactive to light.  Extraocular muscles intact.  No scleral icterus. MOUTH:  Moist mucosal membranes.  Dentition intact.  No abscess noted.   EAR, NOSE, THROAT:  Clear without exudates.  No external lesions. NECK:  Supple.  No thyromegaly.  No nodules.  No JVD.  PULMONARY:  Clear to auscultation bilaterally without wheezes, rales or rhonchi.  No use of accessory muscles.  Good respiratory effort.  Chest nontender to palpation. CARDIOVASCULAR:  S1, S2, regular rate and rhythm, no murmurs, rubs or gallops.  No edema.  Pedal pulses 2+ bilaterally.  GASTROINTESTINAL:  Soft, nontender, nondistended.  No masses.  Positive bowel sounds.  No hepatosplenomegaly.   MUSCULOSKELETAL:  No swelling, clubbing, edema.  Range of motion full in all extremities.   NEUROLOGIC:  Cranial nerves II through XII intact.  No gross focal neurological deficits.  Sensation intact.  Reflexes intact.   SKIN:  No ulceration, lesions, rash or cyanosis.  Skim warm, dry.  Turgor  intact. PSYCHIATRIC:  Mood and affect within normal limits.  The patient is awake, alert and oriented x 3.  Insight and judgment intact.    LABORATORY DATA:  CT chest performed with contrast revealing a 3 x 2 cm foreign body in  the distal esophagus without esophageal mass.  Remainder of laboratory data:  Sodium 140, potassium 3.8, chloride 109, bicarb 30, BUN 10, creatinine 1.26, glucose 99.  LFTs, bilirubin of 2.1, otherwise within normal limits.  WBC 5.5, hemoglobin 13, platelets of 206.   ASSESSMENT AND PLAN:  A 64 year old gentleman with history of hypertension, hyperlipidemia presenting with esophageal foreign body sensation after eating steak.  1.  Esophageal foreign body, as noted on CT.  Case was discussed with gastroenterology in the Emergency Department, plan for endoscopy in the morning with foreign body retrieval as well as possible esophageal dilatation if required.  2.  Hypertension.  While nothing by mouth status we use hydralazine as needed as required for blood pressure control. 3.  Hyperlipidemia, statin therapy when able to tolerate by mouth.   4.  Venous thromboembolism prophylaxis with sequential compression devices. 5.  CODE STATUS:  THE PATIENT IS A FULL CODE.  TIME SPENT:  45 minutes.    ____________________________ Aaron Mose. Hower, MD dkh:ea D: 08/17/2013 00:20:31 ET T: 08/17/2013 01:42:21 ET JOB#: [id number]  cc: Aaron Mose. Hower, MD, <Dictator> DAVID Woodfin Ganja MD ELECTRONICALLY SIGNED 08/17/2013 20:46

## 2014-07-05 NOTE — Discharge Summary (Signed)
PATIENT NAME:  Caleb Davies, Caleb Davies MR#:  [id number] DATE OF BIRTH:  06-Jul-1950  DATE OF ADMISSION:  08/17/2013 DATE OF DISCHARGE:  08/17/2013  CONSULTANTS: Dr. Lucilla Lame from GI.   PRIMARY GASTROINTESTINAL: Dr. Gustavo Lah.   PRIMARY CARE PHYSICIAN: Dr. Rudene Christians of [facility].   CHIEF COMPLAINT: Something stuck in my throat.   DISCHARGE DIAGNOSES: 1. Esophageal foreign body status post esophagogastroduodenoscopy.  2. Esophageal stricture, status post dilation.  3. Hypertension.  4. Hyperlipidemia.  5. History of gastroesophageal reflux disease.  6. History of stroke in the past.   DISCHARGE MEDICATIONS: Aspirin 81 mg daily, atorvastatin once a day as previously taken, hypertension medication as previously taken, Prevacid 30 mg once a day.   DIET: Low sodium GI soft diet.   ACTIVITY: As tolerated.   FOLLOWUP: Please follow with PCP and Dr. Gustavo Lah, your GI doctor for  further swallowing evaluation.   DISPOSITION: Home.   SIGNIFICANT LABS AND IMAGING: Initial white count of 5.5, BUN 10, creatinine 1.26. CT chest with contrast showing 3 x 2 cm foreign body/bezoar in the distal esophagus, no esophageal mass's identified. There may be a distal stricture. No other significant findings.   HISTORY OF PRESENT ILLNESS AND HOSPITAL COURSE: For full details of H and P, please see the dictation on 06/06 by Dr. Lavetta Nielsen, but briefly this is a pleasant 64 year old with history of stroke without residual deficits, hypertension, hyperlipidemia, who came in with presentation of esophageal foreign body sensation. He was eating today prior to arrival a meal of steak and had immediate foreign bodies sensation is located in the retrosternal and lower esophageal sphincter. He had no chest pain or shortness of breath. He came in not tolerating any solid food. The symptoms had started the day prior. CAT scan showed foreign body and he was admitted the hospitalist service for observation with a gastroenterology consult.  He underwent EGD on the 6th, which showed fluid in the middle third of the esophagus with benign appearing intrinsic moderate stenosis found at the GE junction. This was dilated. I discussed with Dr. Allen Norris the GI physician who did the EGD. GI soft diet was recommended and he could  tolerate that he was to be discharged, which did happen. It is also possible that he has other esophageal motility issues as he stated that he has issues off and on for some time now, and any has seen Dr. Gustavo Lah, so it was recommended for him to follow with Dr. Gustavo Lah for further work-up including swallowing evaluation as needed.   TOTAL TIME SPENT: 40 minutes.    ____________________________ Vivien Presto, MD sa:sg D: 08/18/2013 08:25:44 ET T: 08/18/2013 11:05:36 ET JOB#: [id number]  cc: Vivien Presto, MD, <Dictator> Lollie Sails, MD Laurene Footman, MD  Vivien Presto MD ELECTRONICALLY SIGNED 09/10/2013 12:42

## 2017-02-13 DIAGNOSIS — R0989 Other specified symptoms and signs involving the circulatory and respiratory systems: Secondary | ICD-10-CM | POA: Insufficient documentation

## 2017-03-14 DIAGNOSIS — H9221 Otorrhagia, right ear: Secondary | ICD-10-CM

## 2017-03-14 HISTORY — DX: Otorrhagia, right ear: H92.21

## 2017-09-04 DIAGNOSIS — H9221 Otorrhagia, right ear: Secondary | ICD-10-CM | POA: Insufficient documentation

## 2017-10-30 ENCOUNTER — Other Ambulatory Visit: Payer: Self-pay

## 2017-10-30 ENCOUNTER — Telehealth: Payer: Self-pay | Admitting: Gastroenterology

## 2017-10-30 NOTE — Telephone Encounter (Signed)
Contacted pt and advised him he will be due for his repeat colonoscopy in 09/2018.

## 2017-10-30 NOTE — Telephone Encounter (Signed)
Patient LVM wanting to know when he is due for a colonoscopy? Please call

## 2018-09-25 ENCOUNTER — Telehealth: Payer: Self-pay | Admitting: Gastroenterology

## 2018-09-25 NOTE — Telephone Encounter (Signed)
We received return mail back on patient(recall letter to schedule colonoscopy).

## 2018-09-26 ENCOUNTER — Telehealth: Payer: Self-pay | Admitting: Gastroenterology

## 2018-09-26 NOTE — Telephone Encounter (Signed)
Patient called because we received return mail & he called to give correct information. He is in Dr Dorothey Baseman recalls for a colonoscopy & EGD.

## 2018-10-15 ENCOUNTER — Telehealth: Payer: Self-pay

## 2018-10-15 NOTE — Telephone Encounter (Signed)
Patient returned call to schedule his colonoscopy.  He will call me back after he checks with his daughter to schedule.  He is not experiencing any swallowing problems at this time.  Thanks [employer]

## 2018-10-15 NOTE — Telephone Encounter (Signed)
LVM with patient to inform him that he is now due for his repeat colonoscopy with  Dr. Allen Norris, and to call the office to schedule.  Thanks Sharyn Lull  Scheduling Note:  Ask patient if he is experiencing any difficulty swallowing if so we can add an EGD due to previous problems with Dysphagia.  Colonoscopy is due now due to the history of colon polyps.

## 2018-10-16 ENCOUNTER — Other Ambulatory Visit: Payer: Self-pay

## 2018-10-16 DIAGNOSIS — Z86010 Personal history of colonic polyps: Secondary | ICD-10-CM

## 2018-10-17 ENCOUNTER — Encounter: Payer: Self-pay | Admitting: Gastroenterology

## 2018-10-25 ENCOUNTER — Other Ambulatory Visit
Admission: RE | Admit: 2018-10-25 | Discharge: 2018-10-25 | Disposition: A | Discharge status: Home / Self Care | Payer: Medicare Other | Source: Ambulatory Visit | Attending: Gastroenterology | Admitting: Gastroenterology

## 2018-10-25 ENCOUNTER — Other Ambulatory Visit: Payer: Self-pay

## 2018-10-25 DIAGNOSIS — Z20828 Contact with and (suspected) exposure to other viral communicable diseases: Secondary | ICD-10-CM | POA: Insufficient documentation

## 2018-10-25 DIAGNOSIS — Z01812 Encounter for preprocedural laboratory examination: Secondary | ICD-10-CM | POA: Insufficient documentation

## 2018-10-25 NOTE — Anesthesia Preprocedure Evaluation (Addendum)
Anesthesia Evaluation  Patient identified by MRN, date of birth, ID band Patient awake    Reviewed: Allergy & Precautions, NPO status , Patient's Chart, lab work & pertinent test results  History of Anesthesia Complications Negative for: history of anesthetic complications  Airway Mallampati: II   Neck ROM: Full    Dental  (+) Lower Dentures, Upper Dentures   Pulmonary former smoker (quit 2009),    Pulmonary exam normal breath sounds clear to auscultation       Cardiovascular hypertension, Normal cardiovascular exam Rhythm:Regular Rate:Normal     Neuro/Psych CVA (2009), No Residual Symptoms    GI/Hepatic GERD  ,  Endo/Other  diabetes, Type 2  Renal/GU negative Renal ROS     Musculoskeletal   Abdominal   Peds  Hematology negative hematology ROS (+)   Anesthesia Other Findings   Reproductive/Obstetrics                            Anesthesia Physical Anesthesia Plan  ASA: II  Anesthesia Plan: General   Post-op Pain Management:    Induction: Intravenous  PONV Risk Score and Plan: 2 and Propofol infusion and TIVA  Airway Management Planned: Natural Airway  Additional Equipment:   Intra-op Plan:   Post-operative Plan:   Informed Consent: I have reviewed the patients History and Physical, chart, labs and discussed the procedure including the risks, benefits and alternatives for the proposed anesthesia with the patient or authorized representative who has indicated his/her understanding and acceptance.       Plan Discussed with: CRNA  Anesthesia Plan Comments:        Anesthesia Quick Evaluation

## 2018-10-26 LAB — SARS CORONAVIRUS 2 (TAT 6-24 HRS): SARS Coronavirus 2: NEGATIVE

## 2018-10-26 NOTE — Discharge Instructions (Signed)

## 2018-10-29 ENCOUNTER — Ambulatory Visit: Payer: Medicare Other | Admitting: Anesthesiology

## 2018-10-29 ENCOUNTER — Other Ambulatory Visit: Payer: Self-pay

## 2018-10-29 ENCOUNTER — Ambulatory Visit
Admission: RE | Admit: 2018-10-29 | Discharge: 2018-10-29 | Disposition: A | Discharge status: Home / Self Care | Payer: Medicare Other | Attending: Gastroenterology | Admitting: Gastroenterology

## 2018-10-29 ENCOUNTER — Encounter
Admission: RE | Disposition: A | Discharge status: Home / Self Care | Payer: Self-pay | Source: Home / Self Care | Attending: Gastroenterology

## 2018-10-29 DIAGNOSIS — Z7982 Long term (current) use of aspirin: Secondary | ICD-10-CM | POA: Insufficient documentation

## 2018-10-29 DIAGNOSIS — R1310 Dysphagia, unspecified: Secondary | ICD-10-CM | POA: Diagnosis not present

## 2018-10-29 DIAGNOSIS — Z87891 Personal history of nicotine dependence: Secondary | ICD-10-CM | POA: Insufficient documentation

## 2018-10-29 DIAGNOSIS — I10 Essential (primary) hypertension: Secondary | ICD-10-CM | POA: Insufficient documentation

## 2018-10-29 DIAGNOSIS — Z1211 Encounter for screening for malignant neoplasm of colon: Secondary | ICD-10-CM | POA: Insufficient documentation

## 2018-10-29 DIAGNOSIS — Z8673 Personal history of transient ischemic attack (TIA), and cerebral infarction without residual deficits: Secondary | ICD-10-CM | POA: Diagnosis not present

## 2018-10-29 DIAGNOSIS — Z7984 Long term (current) use of oral hypoglycemic drugs: Secondary | ICD-10-CM | POA: Diagnosis not present

## 2018-10-29 DIAGNOSIS — Z860100 Personal history of colon polyps, unspecified: Secondary | ICD-10-CM

## 2018-10-29 DIAGNOSIS — Z981 Arthrodesis status: Secondary | ICD-10-CM | POA: Diagnosis not present

## 2018-10-29 DIAGNOSIS — D122 Benign neoplasm of ascending colon: Secondary | ICD-10-CM | POA: Diagnosis not present

## 2018-10-29 DIAGNOSIS — E119 Type 2 diabetes mellitus without complications: Secondary | ICD-10-CM | POA: Insufficient documentation

## 2018-10-29 DIAGNOSIS — Z888 Allergy status to other drugs, medicaments and biological substances status: Secondary | ICD-10-CM | POA: Insufficient documentation

## 2018-10-29 DIAGNOSIS — K222 Esophageal obstruction: Secondary | ICD-10-CM | POA: Diagnosis not present

## 2018-10-29 DIAGNOSIS — K219 Gastro-esophageal reflux disease without esophagitis: Secondary | ICD-10-CM | POA: Diagnosis not present

## 2018-10-29 DIAGNOSIS — K635 Polyp of colon: Secondary | ICD-10-CM

## 2018-10-29 DIAGNOSIS — K449 Diaphragmatic hernia without obstruction or gangrene: Secondary | ICD-10-CM | POA: Insufficient documentation

## 2018-10-29 DIAGNOSIS — Z885 Allergy status to narcotic agent status: Secondary | ICD-10-CM | POA: Insufficient documentation

## 2018-10-29 HISTORY — PX: ESOPHAGEAL DILATION: SHX303

## 2018-10-29 HISTORY — PX: COLONOSCOPY WITH PROPOFOL: SHX5780

## 2018-10-29 HISTORY — DX: Essential (primary) hypertension: I10

## 2018-10-29 HISTORY — PX: ESOPHAGOGASTRODUODENOSCOPY (EGD) WITH PROPOFOL: SHX5813

## 2018-10-29 HISTORY — PX: POLYPECTOMY: SHX5525

## 2018-10-29 HISTORY — DX: Type 2 diabetes mellitus without complications: E11.9

## 2018-10-29 LAB — GLUCOSE, CAPILLARY
Glucose-Capillary: 146 mg/dL — ABNORMAL HIGH (ref 70–99)
Glucose-Capillary: 137 mg/dL — ABNORMAL HIGH (ref 70–99)

## 2018-10-29 SURGERY — COLONOSCOPY WITH PROPOFOL
Anesthesia: General | Site: Rectum

## 2018-10-29 MED ORDER — ONDANSETRON HCL 4 MG/2ML IJ SOLN: 4.0000 mg | Freq: Once | INTRAMUSCULAR | Status: DC | PRN

## 2018-10-29 MED ORDER — STERILE WATER FOR IRRIGATION IR SOLN
Status: DC | PRN
  Administered 2018-10-29: 08:00:00 30 mL

## 2018-10-29 MED ORDER — LACTATED RINGERS IV SOLN
10.0000 mL/h | INTRAVENOUS | Status: DC
  Administered 2018-10-29: 10 mL/h via INTRAVENOUS

## 2018-10-29 MED ORDER — ACETAMINOPHEN 160 MG/5ML PO SOLN: 325.0000 mg | ORAL | Status: DC | PRN

## 2018-10-29 MED ORDER — LIDOCAINE HCL (CARDIAC) PF 100 MG/5ML IV SOSY
PREFILLED_SYRINGE | INTRAVENOUS | Status: DC | PRN
  Administered 2018-10-29: 30 mg via INTRAVENOUS

## 2018-10-29 MED ORDER — GLYCOPYRROLATE 0.2 MG/ML IJ SOLN
INTRAMUSCULAR | Status: DC | PRN
  Administered 2018-10-29: 0.1 mg via INTRAVENOUS

## 2018-10-29 MED ORDER — ACETAMINOPHEN 325 MG PO TABS: 650.0000 mg | ORAL_TABLET | Freq: Once | ORAL | Status: DC | PRN

## 2018-10-29 MED ORDER — SODIUM CHLORIDE 0.9 % IV SOLN: INTRAVENOUS | Status: DC

## 2018-10-29 MED ORDER — PROPOFOL 10 MG/ML IV BOLUS
INTRAVENOUS | Status: DC | PRN
  Administered 2018-10-29: 20 mg via INTRAVENOUS
  Administered 2018-10-29: 40 mg via INTRAVENOUS
  Administered 2018-10-29: 30 mg via INTRAVENOUS
  Administered 2018-10-29 (×3): 20 mg via INTRAVENOUS
  Administered 2018-10-29: 100 mg via INTRAVENOUS
  Administered 2018-10-29: 30 mg via INTRAVENOUS
  Administered 2018-10-29 (×2): 20 mg via INTRAVENOUS

## 2018-10-29 SURGICAL SUPPLY — 11 items
BALLN DILATOR 15-18 8 (BALLOONS) ×4
BALLOON DILATOR 15-18 8 (BALLOONS) IMPLANT
BLOCK BITE GUARD (MISCELLANEOUS) ×2 IMPLANT
CANISTER SUCT 1200ML W/VALVE (MISCELLANEOUS) ×4 IMPLANT
GOWN CVR UNV OPN BCK APRN NK (MISCELLANEOUS) ×4 IMPLANT
GOWN ISOL THUMB LOOP REG UNIV (MISCELLANEOUS) ×4
KIT ENDO PROCEDURE OLY (KITS) ×4 IMPLANT
SNARE SHORT THROW 13M SML OVAL (MISCELLANEOUS) ×2 IMPLANT
SYR INFLATION 60ML (SYRINGE) ×2 IMPLANT
TRAP ETRAP POLY (MISCELLANEOUS) ×2 IMPLANT
WATER STERILE IRR 250ML POUR (IV SOLUTION) ×6 IMPLANT

## 2018-10-29 NOTE — Transfer of Care (Signed)
Immediate Anesthesia Transfer of Care Note  Patient: Caleb Davies.  Procedure(s) Performed: COLONOSCOPY WITH BIOPSY (N/A Rectum) ESOPHAGOGASTRODUODENOSCOPY (EGD) WITH DILATION (N/A Esophagus) ESOPHAGEAL DILATION (N/A Esophagus) POLYPECTOMY (N/A Rectum)  Patient Location: PACU  Anesthesia Type: General  Level of Consciousness: awake, alert  and patient cooperative  Airway and Oxygen Therapy: Patient Spontanous Breathing and Patient connected to supplemental oxygen  Post-op Assessment: Post-op Vital signs reviewed, Patient's Cardiovascular Status Stable, Respiratory Function Stable, Patent Airway and No signs of Nausea or vomiting  Post-op Vital Signs: Reviewed and stable  Complications: No apparent anesthesia complications

## 2018-10-29 NOTE — Anesthesia Postprocedure Evaluation (Signed)
Anesthesia Post Note  Patient: Caleb Davies.  Procedure(s) Performed: COLONOSCOPY WITH BIOPSY (N/A Rectum) ESOPHAGOGASTRODUODENOSCOPY (EGD) WITH DILATION (N/A Esophagus) ESOPHAGEAL DILATION (N/A Esophagus) POLYPECTOMY (N/A Rectum)  Patient location during evaluation: PACU Anesthesia Type: General Level of consciousness: awake and alert, oriented and patient cooperative Pain management: pain level controlled Vital Signs Assessment: post-procedure vital signs reviewed and stable Respiratory status: spontaneous breathing, nonlabored ventilation and respiratory function stable Cardiovascular status: blood pressure returned to baseline and stable Postop Assessment: adequate PO intake Anesthetic complications: no    Darrin Nipper

## 2018-10-29 NOTE — Anesthesia Procedure Notes (Signed)
Date/Time: 10/29/2018 7:56 AM Performed by: Cameron Ali, CRNA Pre-anesthesia Checklist: Patient identified, Emergency Drugs available, Suction available, Timeout performed and Patient being monitored Patient Re-evaluated:Patient Re-evaluated prior to induction Oxygen Delivery Method: Nasal cannula Placement Confirmation: positive ETCO2

## 2018-10-29 NOTE — Op Note (Addendum)
[facility] Gastroenterology Patient Name: Caleb Davies Procedure Date: 10/29/2018 7:45 AM MRN: [id number] Account #: [id number] Date of Birth: May 06, 1950 Admit Type: Outpatient Age: 68 Room: [facility] OR ROOM 01 Gender: Male Note Status: Finalized Procedure:            Colonoscopy Indications:          High risk colon cancer surveillance: Personal history                        of colonic polyps Providers:            Lucilla Lame MD, MD Medicines:            Propofol per Anesthesia Complications:        No immediate complications. Procedure:            Pre-Anesthesia Assessment:                       - Prior to the procedure, a History and Physical was                        performed, and patient medications and allergies were                        reviewed. The patient's tolerance of previous                        anesthesia was also reviewed. The risks and benefits of                        the procedure and the sedation options and risks were                        discussed with the patient. All questions were                        answered, and informed consent was obtained. Prior                        Anticoagulants: The patient has taken no previous                        anticoagulant or antiplatelet agents. ASA Grade                        Assessment: II - A patient with mild systemic disease.                        After reviewing the risks and benefits, the patient was                        deemed in satisfactory condition to undergo the                        procedure.                       After obtaining informed consent, the colonoscope was                        passed under direct vision. Throughout the  procedure,                        the patient's blood pressure, pulse, and oxygen                        saturations were monitored continuously. The was                        introduced through the anus and advanced to the the         cecum, identified by appendiceal orifice and ileocecal                        valve. The colonoscopy was performed without                        difficulty. The patient tolerated the procedure well. Findings:      The perianal and digital rectal examinations were normal.      A 2 mm polyp was found in the ascending colon. The polyp was sessile.       The polyp was removed with a cold snare. Resection was complete, and       retrieval was complete. Impression:           - One 2 mm polyp in the ascending colon, removed with a                        cold snare. Resected and retrieved. Recommendation:       - Discharge patient to home.                       - Resume previous diet.                       - Continue present medications.                       - Repeat colonoscopy in 5 years for surveillance.                       - Await pathology results. Procedure Code(s):    --- Professional ---                       [phone], Colonoscopy, flexible; with removal of tumor(s),                        polyp(s), or other lesion(s) by snare technique Diagnosis Code(s):    --- Professional ---                       Z86.010, Personal history of colonic polyps                       K63.5, Polyp of colon CPT copyright 2019 American [employer] Association. All rights reserved. The codes documented in this report are preliminary and upon coder review may  be revised to meet current compliance requirements. Lucilla Lame MD, MD 10/29/2018 8:24:59 AM This report has been signed electronically. Number of Addenda: 0 Note Initiated On: 10/29/2018 7:45 AM Scope Withdrawal Time: 0 hours 8 minutes 55 seconds  Total Procedure Duration: 0 hours 12 minutes 8 seconds  Estimated  Blood Loss: Estimated blood loss: none.      [facility]

## 2018-10-29 NOTE — Op Note (Signed)
[facility] Gastroenterology Patient Name: Caleb Davies Procedure Date: 10/29/2018 7:49 AM MRN: [id number] Account #: [id number] Date of Birth: 1950-04-04 Admit Type: Outpatient Age: 68 Room: [facility] OR ROOM 1 Gender: Male Note Status: Finalized Procedure:            Upper GI endoscopy Indications:          Dysphagia Providers:            Lucilla Lame MD, MD Referring MD:         Merwyn Katos, MD (Referring MD) Medicines:            Propofol per Anesthesia Complications:        No immediate complications. Procedure:            Pre-Anesthesia Assessment:                       - Prior to the procedure, a History and Physical was                        performed, and patient medications and allergies were                        reviewed. The patient's tolerance of previous                        anesthesia was also reviewed. The risks and benefits of                        the procedure and the sedation options and risks were                        discussed with the patient. All questions were                        answered, and informed consent was obtained. Prior                        Anticoagulants: The patient has taken no previous                        anticoagulant or antiplatelet agents. ASA Grade                        Assessment: II - A patient with mild systemic disease.                        After reviewing the risks and benefits, the patient was                        deemed in satisfactory condition to undergo the                        procedure.                       After obtaining informed consent, the endoscope was                        passed under direct vision. Throughout the procedure,  the patient's blood pressure, pulse, and oxygen                        saturations were monitored continuously. The                        Endosonoscope was introduced through the mouth, and                        advanced to the second  part of duodenum. The upper GI                        endoscopy was accomplished without difficulty. The                        patient tolerated the procedure well. Findings:      A small hiatal hernia was present.      One benign-appearing, intrinsic mild stenosis was found at the       gastroesophageal junction. The stenosis was traversed. A TTS dilator was       passed through the scope. Dilation with a 15-16.5-18 mm balloon dilator       was performed to 18 mm. The dilation site was examined following       endoscope reinsertion and showed complete resolution of luminal       narrowing.      The stomach was normal.      The examined duodenum was normal. Impression:           - Small hiatal hernia.                       - Benign-appearing esophageal stenosis. Dilated.                       - Normal stomach.                       - Normal examined duodenum.                       - No specimens collected. Recommendation:       - Discharge patient to home.                       - Resume previous diet.                       - Continue present medications. Procedure Code(s):    --- Professional ---                       [phone], Esophagogastroduodenoscopy, flexible, transoral;                        with transendoscopic balloon dilation of esophagus                        (less than 30 mm diameter) Diagnosis Code(s):    --- Professional ---                       R13.10, Dysphagia, unspecified  K22.2, Esophageal obstruction CPT copyright 2019 American Medical Association. All rights reserved. The codes documented in this report are preliminary and upon coder review may  be revised to meet current compliance requirements. Lucilla Lame MD, MD 10/29/2018 8:09:45 AM This report has been signed electronically. Number of Addenda: 0 Note Initiated On: 10/29/2018 7:49 AM Total Procedure Duration: 0 hours 5 minutes 17 seconds  Estimated Blood Loss: Estimated blood loss:  none.      [facility]

## 2018-10-29 NOTE — H&P (Addendum)
Caleb Lame, MD [facility] [address], [place], [place] [address] Phone:[phone] Fax : [phone]  Primary Care Physician:  Rogelia Rohrer, MD Primary Gastroenterologist:  Dr. Allen Norris  Pre-Procedure History & Physical: HPI:  Caleb Meiner. is a 68 y.o. male is here for an endoscopy and colonoscopy.   Past Medical History:  Diagnosis Date  . Diabetes mellitus without complication ([place])   . Hypertension   . Stroke [facility]) 2009    Past Surgical History:  Procedure Laterality Date  . ANTERIOR CERVICAL DECOMP/DISCECTOMY FUSION  2014   C4-7  . COLONOSCOPY N/A 09/2013   repeat due 09/2018  . ESOPHAGOGASTRODUODENOSCOPY N/A 09/2013    Prior to Admission medications   Medication Sig Start Date End Date Taking? Authorizing Provider  amLODipine (NORVASC) 2.5 MG tablet Take 5 mg by mouth daily.  09/12/18  Yes [provider]  aspirin 81 MG chewable tablet Chew by mouth daily.   Yes [provider]  atorvastatin (LIPITOR) 40 MG tablet Take 40 mg by mouth daily. 09/11/18  Yes [provider]  losartan (COZAAR) 100 MG tablet Take 100 mg by mouth daily. 09/29/18  Yes [provider]  metFORMIN (GLUCOPHAGE) 500 MG tablet Take 500 mg by mouth 2 (two) times daily with a meal. 07/20/18  Yes [provider]  pantoprazole (PROTONIX) 40 MG tablet TAKE 1 TABLET BY MOUTH EVERY DAY 07/06/17  Yes [provider]    Allergies as of 10/16/2018 - never reviewed  Allergen Reaction Noted  . Trazodone Swelling 08/03/2012  . Hydrochlorothiazide  08/03/2012  . Codeine Nausea Only 08/03/2012    History reviewed. No pertinent family history.  Social History   Socioeconomic History  . Marital status: Married    Spouse name: Not on file  . Number of children: Not on file  . Years of education: Not on file  . Highest education level: Not on file  Occupational History  . Not on file  Social Needs  . Financial resource strain: Not on file  .  Food insecurity    Worry: Not on file    Inability: Not on file  . Transportation needs    Medical: Not on file    Non-medical: Not on file  Tobacco Use  . Smoking status: Former Smoker    Quit date: 2009    Years since quitting: 11.6  . Smokeless tobacco: Current User    Types: Chew  Substance and Sexual Activity  . Alcohol use: Not Currently  . Drug use: Not on file  . Sexual activity: Not on file  Lifestyle  . Physical activity    Days per week: Not on file    Minutes per session: Not on file  . Stress: Not on file  Relationships  . Social Herbalist on phone: Not on file    Gets together: Not on file    Attends religious service: Not on file    Active member of club or organization: Not on file    Attends meetings of clubs or organizations: Not on file    Relationship status: Not on file  . Intimate partner violence    Fear of current or ex partner: Not on file    Emotionally abused: Not on file    Physically abused: Not on file    Forced sexual activity: Not on file  Other Topics Concern  . Not on file  Social History Narrative  . Not on file  Review of Systems: See HPI, otherwise negative ROS  Physical Exam: BP (!) 152/71   Pulse 82   Temp 99.1 F (37.3 C) (Temporal)   Ht 5\' 11"  (1.803 m)   Wt 96.6 kg   SpO2 99%   BMI 29.71 kg/m  General:   Alert,  pleasant and cooperative in NAD Head:  Normocephalic and atraumatic. Neck:  Supple; no masses or thyromegaly. Lungs:  Clear throughout to auscultation.    Heart:  Regular rate and rhythm. Abdomen:  Soft, nontender and nondistended. Normal bowel sounds, without guarding, and without rebound.   Neurologic:  Alert and  oriented x4;  grossly normal neurologically.  Impression/Plan: Caleb Coria. is here for an endoscopy and colonoscopy to be performed for history of colon polyps 10/03/2013 and dysphagia  Risks, benefits, limitations, and alternatives regarding  endoscopy and colonoscopy  have been reviewed with the patient.  Questions have been answered.  All parties agreeable.   Caleb Lame, MD  10/29/2018, 7:45 AM

## 2018-10-30 ENCOUNTER — Encounter: Payer: Self-pay | Admitting: Gastroenterology

## 2018-11-05 ENCOUNTER — Encounter: Payer: Self-pay | Admitting: Gastroenterology

## 2018-11-06 ENCOUNTER — Encounter: Payer: Self-pay | Admitting: Gastroenterology

## 2019-05-03 ENCOUNTER — Ambulatory Visit: Payer: Medicare Other

## 2019-05-04 ENCOUNTER — Ambulatory Visit: Payer: Medicare Other | Attending: Internal Medicine

## 2019-05-04 DIAGNOSIS — Z23 Encounter for immunization: Secondary | ICD-10-CM | POA: Insufficient documentation

## 2019-05-04 NOTE — Progress Notes (Signed)
   [id number] [facility]  Name:  Caleb Davies.    MRN: [id number] DOB: Apr 10, 1950  05/04/2019  Caleb Davies was observed post Covid-19 immunization for 15 minutes without incidence. He was provided with Vaccine Information Sheet and instruction to access the V-Safe system.   Caleb Davies was instructed to call 911 with any severe reactions post vaccine: Marland Kitchen Difficulty breathing  . Swelling of your face and throat  . A fast heartbeat  . A bad rash all over your body  . Dizziness and weakness    Immunizations Administered    Name Date Dose VIS Date Route   Pfizer COVID-19 Vaccine 05/04/2019  9:13 AM 0.3 mL 02/22/2019 Intramuscular   Manufacturer: [place]   Lot: [id number]   [place]: [id number]

## 2019-05-28 ENCOUNTER — Ambulatory Visit: Payer: Medicare Other | Attending: Internal Medicine

## 2019-05-28 DIAGNOSIS — Z23 Encounter for immunization: Secondary | ICD-10-CM

## 2019-05-28 NOTE — Progress Notes (Signed)
   [id number] [facility]  Name:  Kalid Lightfoot.    MRN: [id number] DOB: 10-04-1950  05/28/2019  Mr. Carranco was observed post Covid-19 immunization for 15 minutes without incident. He was provided with Vaccine Information Sheet and instruction to access the V-Safe system.   Mr. Padgett was instructed to call 911 with any severe reactions post vaccine: Marland Kitchen Difficulty breathing  . Swelling of face and throat  . A fast heartbeat  . A bad rash all over body  . Dizziness and weakness   Immunizations Administered    Name Date Dose VIS Date Route   Pfizer COVID-19 Vaccine 05/28/2019  1:49 PM 0.3 mL 02/22/2019 Intramuscular   Manufacturer: [place]   Lot: [id number]   [place]: [id number]

## 2020-09-03 ENCOUNTER — Telehealth: Payer: Self-pay | Admitting: *Deleted

## 2020-09-03 DIAGNOSIS — Z122 Encounter for screening for malignant neoplasm of respiratory organs: Secondary | ICD-10-CM

## 2020-09-03 DIAGNOSIS — Z87891 Personal history of nicotine dependence: Secondary | ICD-10-CM

## 2020-09-04 NOTE — Telephone Encounter (Signed)
Received referral for initial lung cancer screening scan. Contacted patient and obtained smoking history,(former smoker, quit in 2009, 1 ppd x 40 yrs) as well as answering questions related to screening process. Patient denies signs of lung cancer such as weight loss or hemoptysis. Patient denies comorbidity that would prevent curative treatment if lung cancer were found. Patient is scheduled CT scan on 09/11/20 @ 2:00pm. SDMV already done by Dr. Raul Del.

## 2020-09-11 ENCOUNTER — Ambulatory Visit
Admission: RE | Admit: 2020-09-11 | Discharge: 2020-09-11 | Disposition: A | Discharge status: Home / Self Care | Payer: Medicare Other | Source: Ambulatory Visit | Attending: Oncology | Admitting: Oncology

## 2020-09-11 ENCOUNTER — Other Ambulatory Visit: Payer: Self-pay

## 2020-09-11 DIAGNOSIS — Z87891 Personal history of nicotine dependence: Secondary | ICD-10-CM | POA: Insufficient documentation

## 2020-09-11 DIAGNOSIS — Z122 Encounter for screening for malignant neoplasm of respiratory organs: Secondary | ICD-10-CM

## 2020-09-29 ENCOUNTER — Encounter: Payer: Self-pay | Admitting: *Deleted

## 2020-12-18 DIAGNOSIS — Z23 Encounter for immunization: Secondary | ICD-10-CM

## 2021-11-25 ENCOUNTER — Telehealth: Payer: Self-pay | Admitting: Acute Care

## 2021-11-25 NOTE — Telephone Encounter (Signed)
Attempted to reach pt to schedule annual LDCT-LVMM

## 2021-11-29 ENCOUNTER — Other Ambulatory Visit: Payer: Self-pay | Admitting: *Deleted

## 2021-11-29 DIAGNOSIS — Z87891 Personal history of nicotine dependence: Secondary | ICD-10-CM

## 2021-11-29 DIAGNOSIS — Z122 Encounter for screening for malignant neoplasm of respiratory organs: Secondary | ICD-10-CM

## 2021-12-07 ENCOUNTER — Ambulatory Visit: Payer: Medicare Other

## 2021-12-24 ENCOUNTER — Ambulatory Visit (LOCAL_COMMUNITY_HEALTH_CENTER): Payer: Medicare Other

## 2021-12-24 DIAGNOSIS — Z23 Encounter for immunization: Secondary | ICD-10-CM | POA: Diagnosis not present

## 2021-12-24 DIAGNOSIS — Z719 Counseling, unspecified: Secondary | ICD-10-CM

## 2021-12-24 NOTE — Progress Notes (Signed)
  Are you feeling sick today? No   Have you ever received a dose of COVID-19 Vaccine? [employer], [place], [place], [place], Other) Yes  If yes, which vaccine and how many doses?   PFIZER, 4   Did you bring the vaccination record card or other documentation?  No   Do you have a health condition or are undergoing treatment that makes you moderately or severely immunocompromised? This would include, but not be limited to: cancer, HIV, organ transplant, immunosuppressive therapy/high-dose corticosteroids, or moderate/severe primary immunodeficiency.  No  Have you received COVID-19 vaccine before or during hematopoietic cell transplant (HCT) or CAR-T-cell therapies? No  Have you ever had an allergic reaction to: (This would include a severe allergic reaction or a reaction that caused hives, swelling, or respiratory distress, including wheezing.) A component of a COVID-19 vaccine or a previous dose of COVID-19 vaccine? No   Have you ever had an allergic reaction to another vaccine (other thanCOVID-19 vaccine) or an injectable medication? (This would include a severe allergic reaction or a reaction that caused hives, swelling, or respiratory distress, including wheezing.)   No    Do you have a history of any of the following:  Myocarditis or Pericarditis No  Dermal fillers:  No  Multisystem Inflammatory Syndrome (MIS-C or MIS-A)? No  COVID-19 disease within the past 3 months? No  Vaccinated with monkeypox vaccine in the last 4 weeks? No  Eligible and administered Comirnaty 12Y+2023-2024 and Fluzone high dose. Monitored, tolerated well. Verbalized understanding of VIS and NCIR copy. M.Ritha Sampedro, LPN.

## 2022-04-11 IMAGING — CT CT CHEST LUNG CANCER SCREENING LOW DOSE W/O CM
2 of 5 series · 15 of 40 positions shown, 18 images · non-contrast
Comparison: CT chest dated 08/16/2013

CLINICAL DATA: 69-year-old male former smoker, quit 13 years ago,
with 40 pack-year history of smoking, for initial lung cancer
screening

EXAM:
CT CHEST WITHOUT CONTRAST LOW-DOSE FOR LUNG CANCER SCREENING
TECHNIQUE: Multidetector CT imaging of the chest was performed following the
standard protocol without IV contrast.

[Series 3: lung 1.00 · axial · 0.70mm/px · z∈[-1259,-947]mm · 12 of 344 slices shown, 15 images]
[im 16/344  mediastinal]
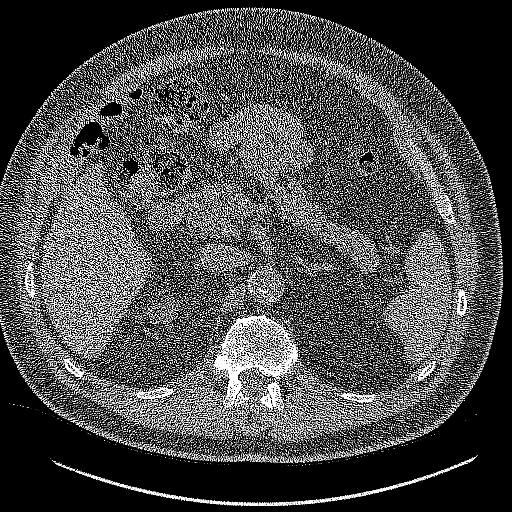
[im 16/344  lung]
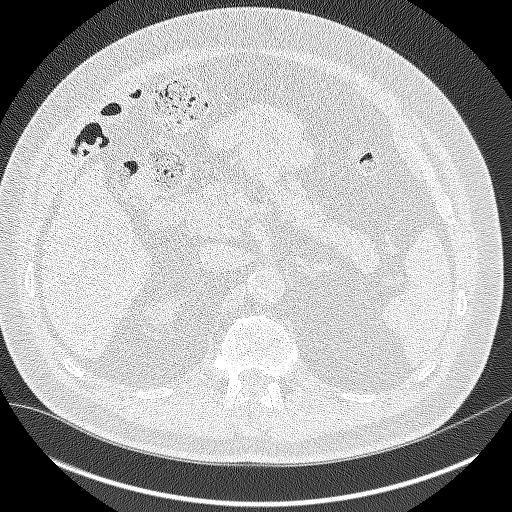
[im 47/344  lung]
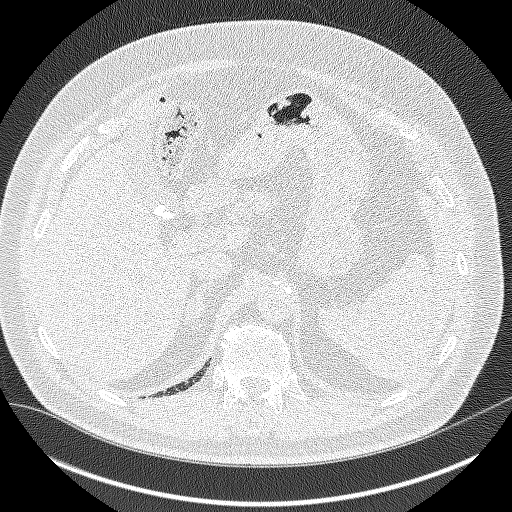
[im 78/344  lung]
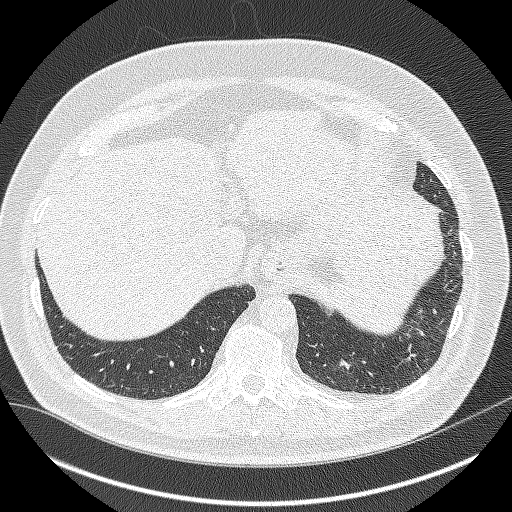
[im 110/344  lung]
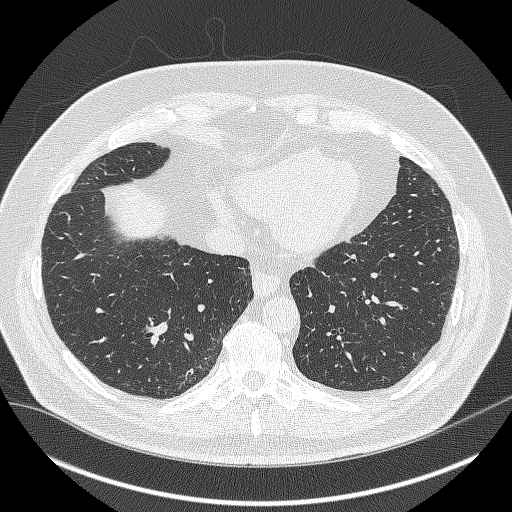
[im 125/344  mediastinal]
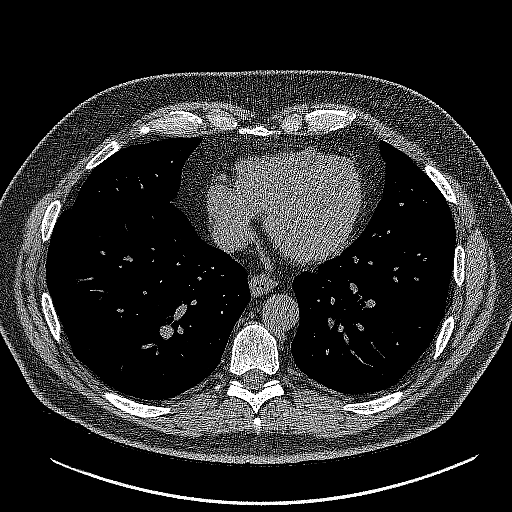
[im 125/344  lung]
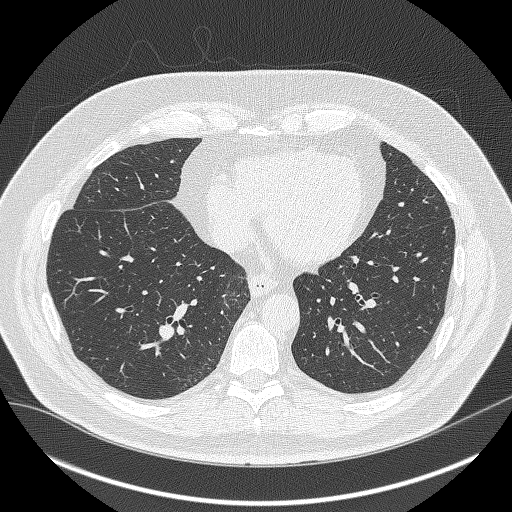
[im 156/344  lung]
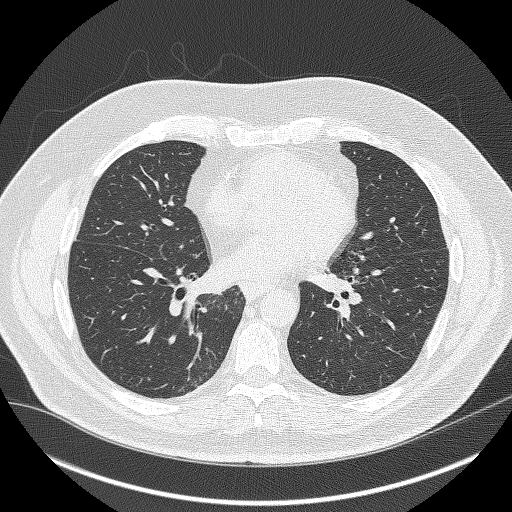
[im 188/344  lung]
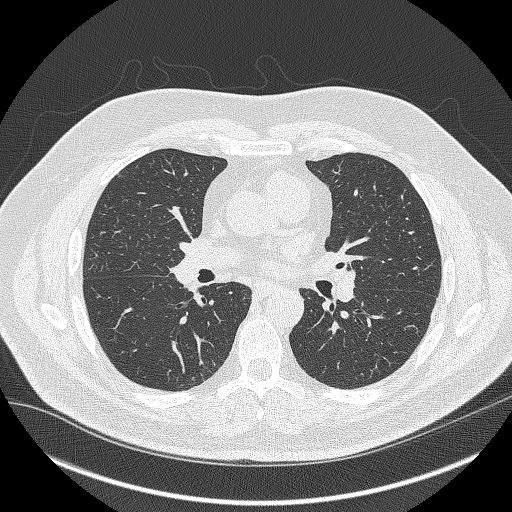
[im 219/344  lung]
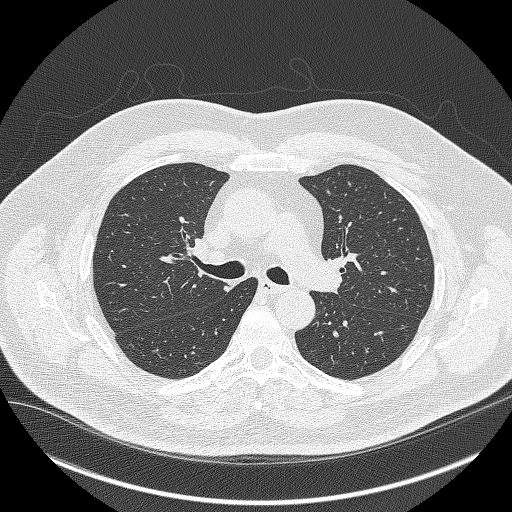
[im 234/344  mediastinal]
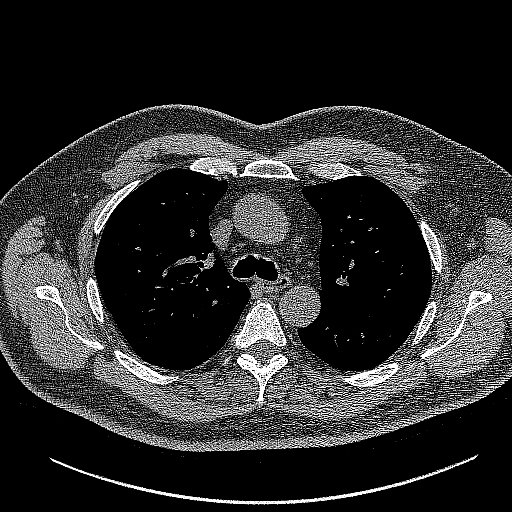
[im 234/344  lung]
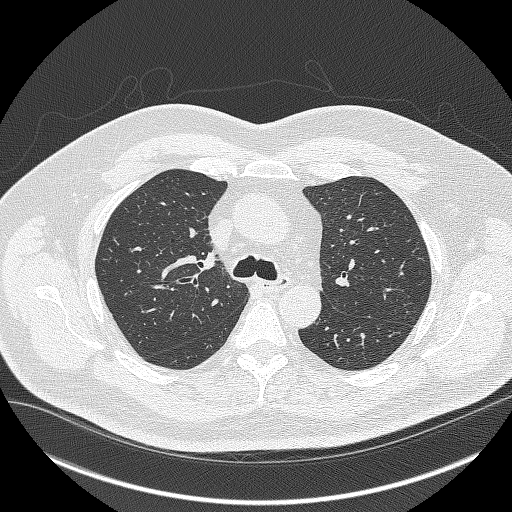
[im 266/344  lung]
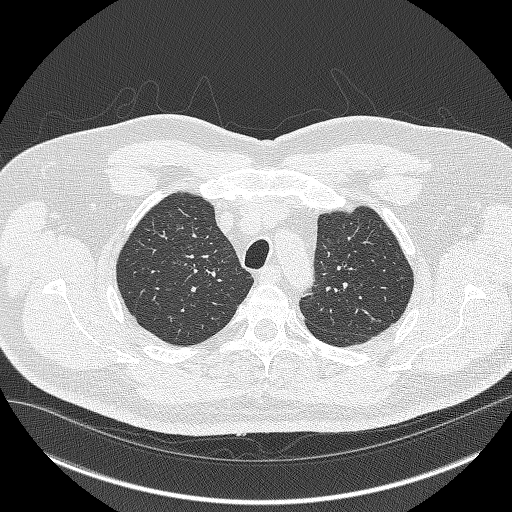
[im 297/344  lung]
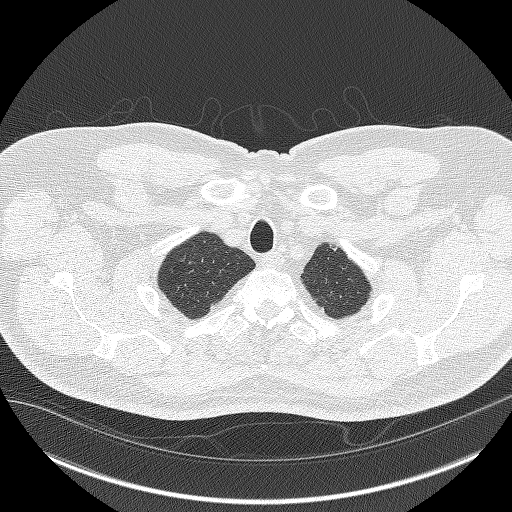
[im 328/344  lung]
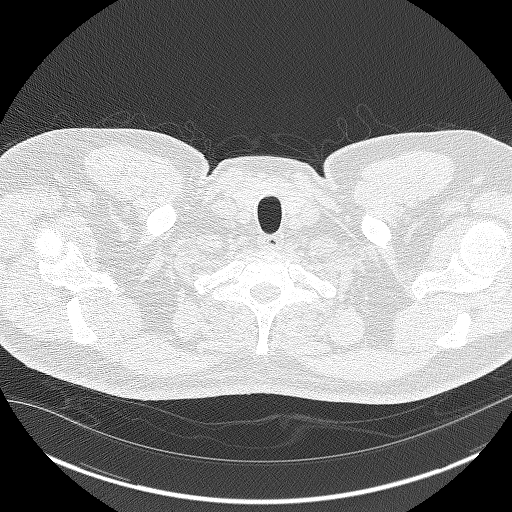

[Series 5: coronals lung 1.00 cor · coronal · 0.67mm/px · 3 of 318 slices shown]
[im 64/318  lung]
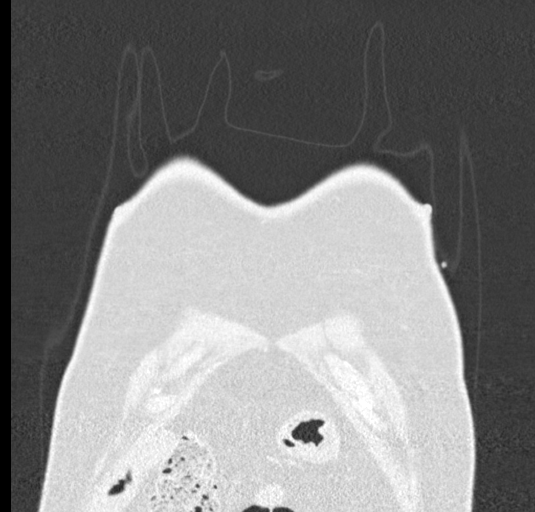
[im 127/318  lung]
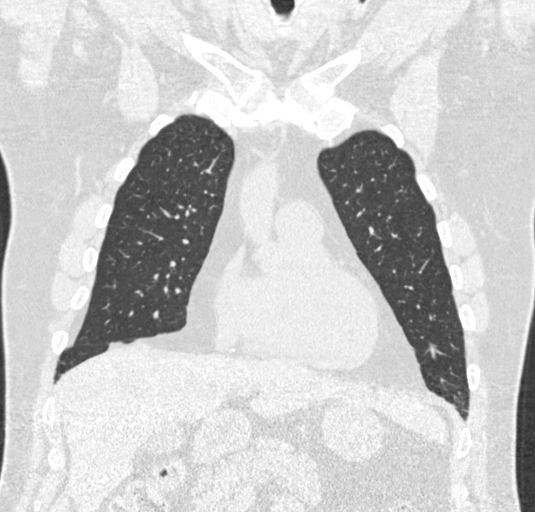
[im 191/318  lung]
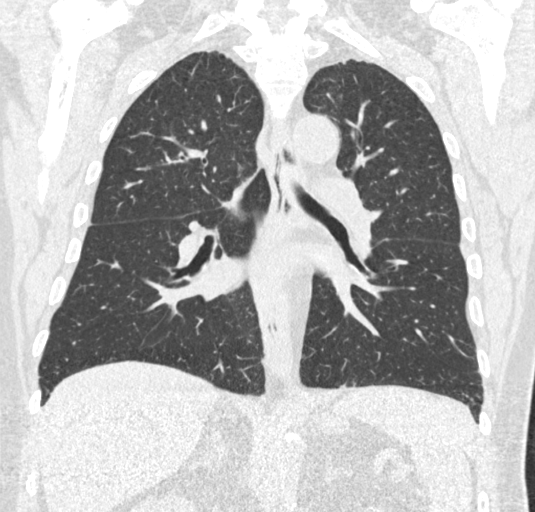

[15 of 40 positions shown; findings below may reference images not displayed]

FINDINGS: Cardiovascular: Heart is normal in size.  No pericardial effusion.

No evidence of thoracic aortic aneurysm. Atherosclerotic
calcifications of the aortic arch.

Three vessel coronary atherosclerosis.

Mediastinum/Nodes: No suspicious mediastinal lymphadenopathy.

Visualized thyroid is unremarkable.

Lungs/Pleura: Mild biapical pleural-parenchymal scarring.

No focal consolidation.

Mild subpleural reticulation/fibrosis in the bilateral lower lobes,
nonspecific, possibly post infectious/inflammatory.

No suspicious pulmonary nodules.

No pleural effusion or pneumothorax.

Upper Abdomen: Visualized upper abdomen is notable for
cholelithiasis (without associated inflammatory changes), a tiny
hiatal hernia, and vascular calcifications.

Musculoskeletal: Mild degenerative changes of the lower
thoracic/upper lumbar spine. Cervical spine fixation hardware,
incompletely visualized.
IMPRESSION: Lung-RADS 1, negative. Continue annual screening with low-dose chest
CT without contrast in 12 months.

Aortic Atherosclerosis ([id number]-MZ8.8).

## 2023-02-17 ENCOUNTER — Telehealth: Payer: Self-pay

## 2023-02-17 NOTE — Telephone Encounter (Signed)
Spoken to patient and notified him that he is not due for a repeat colonoscopy until after 10/29/2023. He will get a recall letter for him to give [place] a call.

## 2023-02-17 NOTE — Telephone Encounter (Signed)
[phone] Pt is here in office pt wants to schedule  appt for colonoscopy.

## 2023-02-17 NOTE — Telephone Encounter (Signed)
Patient verbalized understanding  

## 2023-05-22 ENCOUNTER — Telehealth: Payer: Self-pay

## 2023-05-22 NOTE — Telephone Encounter (Signed)
 The patient called in to schedule his repeat colonoscopy.

## 2023-09-21 ENCOUNTER — Telehealth: Payer: Self-pay

## 2023-09-21 ENCOUNTER — Other Ambulatory Visit: Payer: Self-pay

## 2023-09-21 DIAGNOSIS — Z860100 Personal history of colon polyps, unspecified: Secondary | ICD-10-CM

## 2023-09-21 MED ORDER — NA SULFATE-K SULFATE-MG SULF 17.5-3.13-1.6 GM/177ML PO SOLN: 1.0000 | Freq: Once | ORAL | 0 refills | Status: AC

## 2023-09-21 NOTE — Telephone Encounter (Signed)
 Gastroenterology Pre-Procedure Review  Request Date: 12/19/23 Requesting Physician: Dr. Jinny  PATIENT REVIEW QUESTIONS: The patient responded to the following health history questions as indicated:    1. Are you having any GI issues? After eating greasy foods he has an upset stomach.  Advised him not to eat greasy foods since this is what causes the upset stomach. 2. Do you have a personal history of Polyps? yes (last colonoscopy performed by Dr. Jinny 10/29/2018 recommended repeat in 5 years) 3. Do you have a family history of Colon Cancer or Polyps? no 4. Diabetes Mellitus? yes (takes metformin has been advised to stop 2 days prior to colonoscopy noted on instructions) 5. Joint replacements in the past 12 months?no 6. Major health problems in the past 3 months?no 7. Any artificial heart valves, MVP, or defibrillator?no    MEDICATIONS & ALLERGIES:    Patient reports the following regarding taking any anticoagulation/antiplatelet therapy:   Plavix, Coumadin, Eliquis, Xarelto, Lovenox, Pradaxa, Brilinta, or Effient? no Aspirin? yes (81 mg daily)  Patient confirms/reports the following medications:  Current Outpatient Medications  Medication Sig Dispense Refill   cephALEXin (KEFLEX) 500 MG capsule TAKE 1 CAPSULE (500 MG TOTAL) BY MOUTH THREE (3) TIMES A DAY FOR 14 DAYS.     glucose blood (CONTOUR NEXT TEST) test strip by Other route.     metFORMIN (GLUCOPHAGE-XR) 750 MG 24 hr tablet Take 750 mg by mouth.     Na Sulfate-K Sulfate-Mg Sulfate concentrate (SUPREP) 17.5-3.13-1.6 GM/177ML SOLN Take 1 kit (354 mLs total) by mouth once for 1 dose. 354 mL 0   olmesartan (BENICAR) 40 MG tablet Take 40 mg by mouth daily.     amLODipine (NORVASC) 2.5 MG tablet Take 5 mg by mouth daily.      aspirin 81 MG chewable tablet Chew by mouth daily.     atorvastatin (LIPITOR) 40 MG tablet Take 40 mg by mouth daily.     losartan (COZAAR) 100 MG tablet Take 100 mg by mouth daily.     metoprolol succinate  (TOPROL-XL) 25 MG 24 hr tablet Take 25 mg by mouth daily.     omeprazole (PRILOSEC) 40 MG capsule Take 40 mg by mouth.     pantoprazole (PROTONIX) 40 MG tablet TAKE 1 TABLET BY MOUTH EVERY DAY     No current facility-administered medications for this visit.    Patient confirms/reports the following allergies:  Allergies  Allergen Reactions   Trazodone Swelling   Hydrochlorothiazide Other (See Comments)   Codeine Nausea Only    Other reaction(s): NAUSEA  Other reaction(s): NAUSEA  Other reaction(s): NAUSEA  Other reaction(s): NAUSEAOther reaction(s): NAUSEA    Orders Placed This Encounter  Procedures   Ambulatory referral to Gastroenterology    Referral Priority:   Routine    Referral Type:   Consultation    Referral Reason:   Specialty Services Required    Referred to Provider:   Jinny Carmine, MD    Number of Visits Requested:   1    AUTHORIZATION INFORMATION Primary Insurance: 1D#: Group #:  Secondary Insurance: 1D#: Group #:  SCHEDULE INFORMATION: Date: 12/19/23 Time: Location: [facility]

## 2023-12-02 NOTE — Telephone Encounter (Signed)
 Upcoming Appt: Future Appointments  Date Time Provider Department Center  02/05/2024  1:45 PM Roe Doffing, MD [facility]  02/19/2024  8:40 AM Min, Norleen Gerald, MD [facility]  05/20/2024  8:00 AM Min, Norleen Gerald, MD [facility]    Disposition: See Physician Within 24 Hours  Encounter Reason for Disposition: .  Blood in urine  (Exception: Could be normal menstrual bleeding.)   Is this a pediatric patient?  No   Any recent, relevant visit?  No  Any relevant medical history?  Yes   What history? Recent UTI in August  Gall stones   Any interventions?  Yes   What has been done? (include related medication given, dose, and date/time) Was on doxycycline x 10 days in August    [employer] Eligibility  Patient is eligible for [facility] (according to most recent zip code and insurance information)?  No  Patient is [facility] eligible and appropriate for referral: No   Encounter Initial Assessment: 1. COLOR of URINE: Describe the color of the urine.  (e.g., tea-colored, pink, red, bloody) Do you have blood clots in your urine? (e.g., none, pea, grape, small coin)     Drops of red earlier now has Strings of red, small clot, pink urine 2. ONSET: When did the bleeding start?      1 pm  3. EPISODES: How many times has there been blood in the urine? or How many times today?     Twice today  4. PAIN with URINATION: Is there any pain with passing your urine? If Yes, ask: How bad is the pain?  (Scale 1-10; or mild, moderate, severe)     No pain  5. FEVER: Do you have a fever? If Yes, ask: What is your temperature, how was it measured, and when did it start?     No fever  6. ASSOCIATED SYMPTOMS: Are you passing urine more frequently than usual?     No  7. OTHER SYMPTOMS: Do you have any other symptoms? (e.g., back/flank pain, abdomen pain, vomiting)     Urgency  8. PREGNANCY: Is there any chance you are  pregnant? When was your last menstrual period?     No   Encounter Protocols Used: Urine - Blood In-A-AH

## 2023-12-03 ENCOUNTER — Emergency Department

## 2023-12-03 ENCOUNTER — Other Ambulatory Visit: Payer: Self-pay

## 2023-12-03 ENCOUNTER — Emergency Department: Admission: EM | Admit: 2023-12-03 | Discharge: 2023-12-03 | Disposition: A | Discharge status: Home / Self Care

## 2023-12-03 DIAGNOSIS — E1122 Type 2 diabetes mellitus with diabetic chronic kidney disease: Secondary | ICD-10-CM | POA: Insufficient documentation

## 2023-12-03 DIAGNOSIS — N3289 Other specified disorders of bladder: Secondary | ICD-10-CM | POA: Diagnosis not present

## 2023-12-03 DIAGNOSIS — N189 Chronic kidney disease, unspecified: Secondary | ICD-10-CM | POA: Diagnosis not present

## 2023-12-03 DIAGNOSIS — N3001 Acute cystitis with hematuria: Secondary | ICD-10-CM | POA: Insufficient documentation

## 2023-12-03 DIAGNOSIS — R319 Hematuria, unspecified: Secondary | ICD-10-CM | POA: Diagnosis present

## 2023-12-03 LAB — URINALYSIS, ROUTINE W REFLEX MICROSCOPIC
Bilirubin Urine: NEGATIVE
Glucose, UA: NEGATIVE mg/dL
Ketones, ur: NEGATIVE mg/dL
Leukocytes,Ua: NEGATIVE
Nitrite: NEGATIVE
Protein, ur: 100 mg/dL — AB
RBC / HPF: >50 RBC/hpf (ref 0–5)
Specific Gravity, Urine: 1.008 (ref 1.005–1.030)
Squamous Epithelial / HPF: 0 /HPF (ref 0–5)
pH: 6 (ref 5.0–8.0)

## 2023-12-03 LAB — CBC WITH DIFFERENTIAL/PLATELET
Abs Immature Granulocytes: 0.01 K/uL (ref 0.00–0.07)
Basophils Absolute: 0.1 K/uL (ref 0.0–0.1)
Basophils Relative: 1 %
Eosinophils Absolute: 0.5 K/uL (ref 0.0–0.5)
Eosinophils Relative: 9 %
HCT: 39 % (ref 39.0–52.0)
Hemoglobin: 13.8 g/dL (ref 13.0–17.0)
Immature Granulocytes: 0 %
Lymphocytes Relative: 22 %
Lymphs Abs: 1.1 K/uL (ref 0.7–4.0)
MCH: 31.9 pg (ref 26.0–34.0)
MCHC: 35.4 g/dL (ref 30.0–36.0)
MCV: 90.1 fL (ref 80.0–100.0)
Monocytes Absolute: 0.5 K/uL (ref 0.1–1.0)
Monocytes Relative: 9 %
Neutro Abs: 3.1 K/uL (ref 1.7–7.7)
Neutrophils Relative %: 59 %
Platelets: 213 K/uL (ref 150–400)
RBC: 4.33 MIL/uL (ref 4.22–5.81)
RDW: 13.2 % (ref 11.5–15.5)
WBC: 5.2 K/uL (ref 4.0–10.5)
nRBC: 0 % (ref 0.0–0.2)

## 2023-12-03 LAB — COMPREHENSIVE METABOLIC PANEL WITH GFR
ALT: 17 U/L (ref 0–44)
AST: 23 U/L (ref 15–41)
Albumin: 3.8 g/dL (ref 3.5–5.0)
Alkaline Phosphatase: 71 U/L (ref 38–126)
Anion gap: 14 (ref 5–15)
BUN: 15 mg/dL (ref 8–23)
CO2: 22 mmol/L (ref 22–32)
Calcium: 9.7 mg/dL (ref 8.9–10.3)
Chloride: 105 mmol/L (ref 98–111)
Creatinine, Ser: 1.58 mg/dL — ABNORMAL HIGH (ref 0.61–1.24)
GFR, Estimated: 46 mL/min — ABNORMAL LOW (ref >60)
Glucose, Bld: 145 mg/dL — ABNORMAL HIGH (ref 70–99)
Potassium: 4.3 mmol/L (ref 3.5–5.1)
Sodium: 141 mmol/L (ref 135–145)
Total Bilirubin: 3.4 mg/dL — ABNORMAL HIGH (ref 0.0–1.2)
Total Protein: 7.4 g/dL (ref 6.5–8.1)

## 2023-12-03 LAB — CK: Total CK: 43 U/L — ABNORMAL LOW (ref 49–397)

## 2023-12-03 MED ORDER — CEFTRIAXONE SODIUM 1 G IJ SOLR
1.0000 g | Freq: Once | INTRAMUSCULAR | Status: AC
  Administered 2023-12-03: 1 g via INTRAVENOUS
  Filled 2023-12-03: qty 10

## 2023-12-03 MED ORDER — SODIUM CHLORIDE 0.9 % IV BOLUS
500.0000 mL | Freq: Once | INTRAVENOUS | Status: AC
  Administered 2023-12-03: 500 mL via INTRAVENOUS

## 2023-12-03 MED ORDER — IOHEXOL 300 MG/ML  SOLN: 100.0000 mL | Freq: Once | INTRAMUSCULAR | Status: DC | PRN

## 2023-12-03 MED ORDER — CEFDINIR 300 MG PO CAPS: 300.0000 mg | ORAL_CAPSULE | Freq: Two times a day (BID) | ORAL | 0 refills | Status: AC

## 2023-12-03 NOTE — ED Notes (Signed)
 Meal tray delivered and patient in the room feeding self.

## 2023-12-03 NOTE — ED Triage Notes (Addendum)
 Pt to ED via POV from home. Pt ambulatory to triage. Pt reports hematuria yesterday. Pt has Gatorade bottle of bloody urine. Pt denies pain. Hx of stroke. Denies hx of prostate issues or kidney stones.

## 2023-12-03 NOTE — Discharge Instructions (Signed)
 You were seen in the emergency department for gross hematuria.  Workup demonstrated that this may be a bladder cancer.  Please call urology office first thing on Monday and states that you was seen in the emergency department and Dr. Cam wants you to be seen expeditiously.  Take the full course of your antibiotics.  Ensure adequate hydration.  Return with any acutely worsening symptoms or any other emergency. -- RETURN PRECAUTIONS & AFTERCARE: (ENGLISH) RETURN PRECAUTIONS: Return immediately to the emergency department or see/call your doctor if you feel worse, weak or have changes in speech or vision, are short of breath, have fever, vomiting, pain, bleeding or dark stool, trouble urinating or any new issues. Return here or see/call your doctor if not improving as expected for your suspected condition. FOLLOW-UP CARE: Call your doctor and/or any doctors we referred you to for more advice and to make an appointment. Do this today, tomorrow or after the weekend. Some doctors only take PPO insurance so if you have HMO insurance you may want to contact your HMO or your regular doctor for referral to a specialist within your plan. Either way tell the doctor's office that it was a referral from the emergency department so you get the soonest possible appointment.  YOUR TEST RESULTS: Take result reports of any blood or urine tests, imaging tests and EKG's to your doctor and any referral doctor. Have any abnormal tests repeated. Your doctor or a referral doctor can let you know when this should be done. Also make sure your doctor contacts this hospital to get any test results that are not currently available such as cultures or special tests for infection and final imaging reports, which are often not available at the time you leave the ER but which may list additional important findings that are not documented on the preliminary report. BLOOD PRESSURE: If your blood pressure was greater than 120/80 have your  blood pressure rechecked within 1 to 2 weeks. MEDICATION SIDE EFFECTS: Do not drive, walk, bike, take the bus, etc. if you have received or are being prescribed any sedating medications such as those for pain or anxiety or certain antihistamines like Benadryl. If you have been give one of these here get a taxi home or have a friend drive you home. Ask your pharmacist to counsel you on potential side effects of any new medication

## 2023-12-03 NOTE — ED Provider Notes (Signed)
 [facility] Provider Note    Event Date/Time   First MD Initiated Contact with Patient 12/03/23 [phone]     (approximate)   History   Hematuria (/)   HPI  Camdin Hegner. is a 73 y.o. male GERD, type 2 diabetes, hyperlipidemia, stage III kidney disease who presents to the emergency department with painless hematuria.  Patient states that he was in his usual state of health and was mowing the lawn yesterday and felt as if he got dehydrated.  He went to drink a lot of fluid and when he urinated there was a copious amount of blood.  He states since then his urine has been clearing.  He denies any flank pain abdominal pain or changes in his penis.  Patient tells me he has 1 prior episode of painless hematuria which cleared with antibiotics.  He is a longtime smoker (in remission since 2009).  He has never followed up with urology.  Since yesterday, he reports that his urine has been clearing      Physical Exam   Triage Vital Signs: ED Triage Vitals  Encounter Vitals Group     BP 12/03/23 0847 138/84     Girls Systolic BP Percentile --      Girls Diastolic BP Percentile --      Boys Systolic BP Percentile --      Boys Diastolic BP Percentile --      Pulse Rate 12/03/23 0847 77     Resp 12/03/23 0848 18     Temp 12/03/23 0845 98.1 F (36.7 C)     Temp Source 12/03/23 0845 Oral     SpO2 12/03/23 0847 98 %     Weight --      Height --      Head Circumference --      Peak Flow --      Pain Score 12/03/23 0845 0     Pain Loc --      Pain Education --      Exclude from Growth Chart --     Most recent vital signs: Vitals:   12/03/23 0848 12/03/23 1321  BP:  129/78  Pulse:  68  Resp: 18 18  Temp:  98.3 F (36.8 C)  SpO2:  100%    Nursing Triage Note reviewed. Vital signs reviewed and patients oxygen saturation is normoxic  General: Patient is well nourished, well developed, awake and alert, resting comfortably in no acute distress Head:  Normocephalic and atraumatic Eyes: Normal inspection, extraocular muscles intact, no conjunctival pallor Ear, nose, throat: Normal external exam Neck: Normal range of motion Respiratory: Patient is in no respiratory distress, lungs CTAB Cardiovascular: Patient is not tachycardic, RRR without murmur appreciated GI: Abd SNT with no guarding or rebound  Back: Normal inspection of the back with good strength and range of motion throughout all ext Extremities: pulses intact with good cap refills, no LE pitting edema or calf tenderness Neuro: The patient is alert and oriented to person, place, and time, appropriately conversive, with 5/5 bilat UE/LE strength, no gross motor or sensory defects noted. Coordination appears to be adequate. Skin: Warm, dry, and intact Psych: normal mood and affect, no SI or HI Patient has a bottle of bloody urine at bedside  ED Results / Procedures / Treatments   Labs (all labs ordered are listed, but only abnormal results are displayed) Labs Reviewed  URINALYSIS, ROUTINE W REFLEX MICROSCOPIC - Abnormal; Notable for the following components:  Result Value   Color, Urine AMBER (*)    APPearance HAZY (*)    Hgb urine dipstick LARGE (*)    Protein, ur 100 (*)    Bacteria, UA RARE (*)    All other components within normal limits  COMPREHENSIVE METABOLIC PANEL WITH GFR - Abnormal; Notable for the following components:   Glucose, Bld 145 (*)    Creatinine, Ser 1.58 (*)    Total Bilirubin 3.4 (*)    GFR, Estimated 46 (*)    All other components within normal limits  CK - Abnormal; Notable for the following components:   Total CK 43 (*)    All other components within normal limits  URINE CULTURE  CBC WITH DIFFERENTIAL/PLATELET     EKG None  RADIOLOGY US  renal: No impressive amount of clot but does have a bladder mass concerning for cancer on my independent review interpretation and radiologist agrees.     PROCEDURES:  Critical Care performed:  No  Procedures   MEDICATIONS ORDERED IN ED: Medications  iohexol  (OMNIPAQUE ) 300 MG/ML solution 100 mL ( Intravenous Canceled Entry 12/03/23 1011)  sodium chloride  0.9 % bolus 500 mL (0 mLs Intravenous Stopped 12/03/23 1102)  sodium chloride  0.9 % bolus 500 mL (0 mLs Intravenous Stopped 12/03/23 1102)  cefTRIAXone  (ROCEPHIN ) 1 g in sodium chloride  0.9 % 100 mL IVPB (0 g Intravenous Stopped 12/03/23 1150)     IMPRESSION / MDM / ASSESSMENT AND PLAN / ED COURSE                                Differential diagnosis includes, but is not limited to, UTI, acute renal insufficiency, bladder malignancy   ED course: Patient is largely asymptomatic and has no CVA tenderness or abdominal tenderness to palpation.  He does have gross hematuria but no profound acute anemia.  He does have a mild acute renal insufficiency with a GFR of 46.  Renal ultrasound demonstrated a likely bladder mass but no significant clot.  His CT hematuria was recommended however given patient's low GFR and the fact that this would not likely change management I did discuss not obtaining this acutely with urologist and he is in agreement.  Patient was advised that his symptoms are likely secondary to bladder malignancy and possible super imposed UTI.  He received a dose of ceftriaxone  here and was sent him home with cefdinir  (the cefpodoxime is more expensive given patient's insurance).  He will call urology office on Monday   Clinical Course as of 12/03/23 1545  Sun Dec 03, 2023  0958 Creatinine(!): 1.58 Slightly worse than baseline we will administer another dose of IV fluids [HD]  1000 Will cancel the CT hematuria and proceed with ultrasound instead, I discussed such with CT tech [HD]  1106 Urinalysis, Routine w reflex microscopic -Urine, Clean Catch(!) Will send for urine culture [HD]  1107 Review of prior urine cultures without abnormality will start the patient on cefpodoxime [HD]  1246 Reaching out to urology [HD]   1259 Case discussed with Dr. Cam.  Agrees that the CT hematuria is not urgent.  If he is not obstructed and sterile without any pain he can likely return home with antibiotics and follow-up expeditiously in urology clinic.  Dr. Cam states that he will attempt to send a message to clinic to get him in faster.  Will discuss with the patient [HD]  1303 Patient remains asymptomatic repeat abdominal exam and  flank pain benign.  I reviewed with the patient that the results of the ultrasound is highly concerning for cancer of the bladder and this needs to be addressed in the next week as an outpatient.  He will call the urology office on Monday.  Will send a course of cefoxitin to the patient's pharmacy of choice.  Return precautions given and patient voiced understanding.  We specifically reviewed obstruction.  Occasions answered and patient requested discharge [HD]    Clinical Course User Index [HD] Nicholaus Rolland BRAVO, MD   At time of discharge there is no evidence of acute life, limb, vision, or fertility threat. Patient has stable vital signs, pain is well controlled, patient is ambulatory and p.o. tolerant.  Discharge instructions were completed using the EPIC system. I would refer you to those at this time. All warnings prescriptions follow-up etc. were discussed in detail with the patient. Patient indicates understanding and is agreeable with this plan. All questions answered.  Patient is made aware that they may return to the emergency department for any worsening or new condition or for any other emergency.   -- Risk: 5 This patient has a high risk of morbidity due to further diagnostic testing or treatment. Rationale: This patient's evaluation and management involve a high risk of morbidity due to the potential severity of presenting symptoms, need for diagnostic testing, and/or initiation of treatment that may require close monitoring. The differential includes conditions with potential  for significant deterioration or requiring escalation of care. Treatment decisions in the ED, including medication administration, procedural interventions, or disposition planning, reflect this level of risk. COPA: 5 The patient has the following acute or chronic illness/injury that poses a possible threat to life or bodily function: [X] : The patient has a potentially serious acute condition or an acute exacerbation of a chronic illness requiring urgent evaluation and management in the Emergency Department. The clinical presentation necessitates immediate consideration of life-threatening or function-threatening diagnoses, even if they are ultimately ruled out.   FINAL CLINICAL IMPRESSION(S) / ED DIAGNOSES   Final diagnoses:  Hematuria, unspecified type  Acute cystitis with hematuria  Bladder mass     Rx / DC Orders   ED Discharge Orders          Ordered    cefdinir  (OMNICEF ) 300 MG capsule  2 times daily        12/03/23 1305             Note:  This document was prepared using Dragon voice recognition software and may include unintentional dictation errors.   Nicholaus Rolland BRAVO, MD 12/03/23 [phone]

## 2023-12-03 NOTE — ED Notes (Signed)
 ED Provider at bedside.

## 2023-12-04 LAB — URINE CULTURE: Culture: <10000 — AB

## 2023-12-12 DIAGNOSIS — N3289 Other specified disorders of bladder: Secondary | ICD-10-CM

## 2023-12-12 HISTORY — DX: Other specified disorders of bladder: N32.89

## 2023-12-12 NOTE — H&P (View-Only) (Signed)
 12/20/23 9:24 AM   Caleb Davies 06-21-1950 [id number]   HPI: 73 y.o. male here for initial evaluation of GH + suspicious bladder mass ED visit (12/03/2023)-painless gross hematuria x2 RBUS-concerning for bladder mass, 4.1 cm left bladder sidewall lesion with internal Doppler flow  Denies further GH, asymptomatic from GU standpoint today  No prior urologic conditions No Fhx of GU malignancies  Longtime smoker, quit in 2009 -40 pack year prior to that Hx of prior CVA v(2009), DM2      PMH: Past Medical History:  Diagnosis Date   Diabetes mellitus without complication (HCC)    Hypertension    Stroke [facility]) 2009    Surgical History: Past Surgical History:  Procedure Laterality Date   ANTERIOR CERVICAL DECOMP/DISCECTOMY FUSION  2014   C4-7   COLONOSCOPY N/A 09/2013   repeat due 09/2018   COLONOSCOPY N/A 12/19/2023   Procedure: COLONOSCOPY;  Surgeon: Jinny Carmine, MD;  Location: [facility] ENDOSCOPY;  Service: Endoscopy;  Laterality: N/A;   COLONOSCOPY WITH PROPOFOL  N/A 10/29/2018   Procedure: COLONOSCOPY WITH BIOPSY;  Surgeon: Jinny Carmine, MD;  Location: [facility];  Service: Endoscopy;  Laterality: N/A;  DIABETIC doesnt want arrival time earlier than 0730   ESOPHAGEAL DILATION N/A 10/29/2018   Procedure: ESOPHAGEAL DILATION;  Surgeon: Jinny Carmine, MD;  Location: [facility];  Service: Endoscopy;  Laterality: N/A;   ESOPHAGOGASTRODUODENOSCOPY N/A 09/2013   ESOPHAGOGASTRODUODENOSCOPY (EGD) WITH PROPOFOL  N/A 10/29/2018   Procedure: ESOPHAGOGASTRODUODENOSCOPY (EGD) WITH DILATION;  Surgeon: Jinny Carmine, MD;  Location: [facility];  Service: Endoscopy;  Laterality: N/A;   POLYPECTOMY N/A 10/29/2018   Procedure: POLYPECTOMY;  Surgeon: Jinny Carmine, MD;  Location: [facility];  Service: Endoscopy;  Laterality: N/A;   POLYPECTOMY  12/19/2023   Procedure: POLYPECTOMY, INTESTINE;  Surgeon: Jinny Carmine, MD;  Location: [facility] ENDOSCOPY;  Service: Endoscopy;;     Family History: No family history on file.  Social History:  reports that he quit smoking about 16 years ago. His smoking use included cigarettes. He started smoking about 56 years ago. He has a 40 pack-year smoking history. His smokeless tobacco use includes chew. He reports that he does not currently use alcohol. He reports that he does not use drugs.      Physical Exam: BP 138/80 (BP Location: Left Arm, Patient Position: Sitting, Cuff Size: Normal)   Pulse 84   Wt 197 lb (89.4 kg)   SpO2 98%   BMI 28.27 kg/m    Constitutional:  Alert and oriented, No acute distress. Cardiovascular: No clubbing, cyanosis, or edema. Respiratory: Normal respiratory effort, no increased work of breathing. GI: Nondistended Skin: No rashes, bruises or suspicious lesions. Neurologic: Grossly intact, no focal deficits, moving all 4 extremities. Psychiatric: Normal mood and affect.  Laboratory Data:  Latest Reference Range & Units 12/03/23 09:00  Creatinine 0.61 - 1.24 mg/dL 8.41 (H)     Pertinent Imaging: I have personally viewed and interpreted the RBUS (12/03/2023)-4.1 cm left sidewall bladder mass with internal Doppler flow, concerning for malignancy.  Upper tracts appear morphologically normal, no hydronephrosis.    Assessment & Plan:    Bladder mass Assessment & Plan: 4.1 cm bladder mass via RBUS (Sept 2025) Ongoing GH   I reviewed his clinical history and recent ultrasound imaging.  This is concerning for an underlying bladder cancer.  Recommend TURBT for confirmation diagnosis and histologic staging.  I have reviewed this procedure with him in detail today, including expected perioperative pathway, expected outcomes, recovery and possible  complications.  I reviewed the role for single dose intravesical gemcitabine.  I also reviewed the possibility of left ureteral stent placement if resection involves left ureteral orifice.  - schedule for OR: cystoscopy, TURBT (2-5 cm), possible left  RPG, left ureteral stent placement, intravesical gemcitabine  - UA w/ reflex culture today  - hold ASA 81, 5 days prior to surgery  Orders: -     Ambulatory Referral For Surgery Scheduling      Penne Skye, MD 12/20/2023  [facility] [address], [address] [place], [place] [address] [phone]

## 2023-12-12 NOTE — Progress Notes (Signed)
 12/20/23 9:24 AM   Caleb Davies 06-21-1950 [id number]   HPI: 73 y.o. male here for initial evaluation of GH + suspicious bladder mass ED visit (12/03/2023)-painless gross hematuria x2 RBUS-concerning for bladder mass, 4.1 cm left bladder sidewall lesion with internal Doppler flow  Denies further GH, asymptomatic from GU standpoint today  No prior urologic conditions No Fhx of GU malignancies  Longtime smoker, quit in 2009 -40 pack year prior to that Hx of prior CVA v(2009), DM2      PMH: Past Medical History:  Diagnosis Date   Diabetes mellitus without complication (HCC)    Hypertension    Stroke [facility]) 2009    Surgical History: Past Surgical History:  Procedure Laterality Date   ANTERIOR CERVICAL DECOMP/DISCECTOMY FUSION  2014   C4-7   COLONOSCOPY N/A 09/2013   repeat due 09/2018   COLONOSCOPY N/A 12/19/2023   Procedure: COLONOSCOPY;  Surgeon: Jinny Carmine, MD;  Location: [facility] ENDOSCOPY;  Service: Endoscopy;  Laterality: N/A;   COLONOSCOPY WITH PROPOFOL  N/A 10/29/2018   Procedure: COLONOSCOPY WITH BIOPSY;  Surgeon: Jinny Carmine, MD;  Location: [facility];  Service: Endoscopy;  Laterality: N/A;  DIABETIC doesnt want arrival time earlier than 0730   ESOPHAGEAL DILATION N/A 10/29/2018   Procedure: ESOPHAGEAL DILATION;  Surgeon: Jinny Carmine, MD;  Location: [facility];  Service: Endoscopy;  Laterality: N/A;   ESOPHAGOGASTRODUODENOSCOPY N/A 09/2013   ESOPHAGOGASTRODUODENOSCOPY (EGD) WITH PROPOFOL  N/A 10/29/2018   Procedure: ESOPHAGOGASTRODUODENOSCOPY (EGD) WITH DILATION;  Surgeon: Jinny Carmine, MD;  Location: [facility];  Service: Endoscopy;  Laterality: N/A;   POLYPECTOMY N/A 10/29/2018   Procedure: POLYPECTOMY;  Surgeon: Jinny Carmine, MD;  Location: [facility];  Service: Endoscopy;  Laterality: N/A;   POLYPECTOMY  12/19/2023   Procedure: POLYPECTOMY, INTESTINE;  Surgeon: Jinny Carmine, MD;  Location: [facility] ENDOSCOPY;  Service: Endoscopy;;     Family History: No family history on file.  Social History:  reports that he quit smoking about 16 years ago. His smoking use included cigarettes. He started smoking about 56 years ago. He has a 40 pack-year smoking history. His smokeless tobacco use includes chew. He reports that he does not currently use alcohol. He reports that he does not use drugs.      Physical Exam: BP 138/80 (BP Location: Left Arm, Patient Position: Sitting, Cuff Size: Normal)   Pulse 84   Wt 197 lb (89.4 kg)   SpO2 98%   BMI 28.27 kg/m    Constitutional:  Alert and oriented, No acute distress. Cardiovascular: No clubbing, cyanosis, or edema. Respiratory: Normal respiratory effort, no increased work of breathing. GI: Nondistended Skin: No rashes, bruises or suspicious lesions. Neurologic: Grossly intact, no focal deficits, moving all 4 extremities. Psychiatric: Normal mood and affect.  Laboratory Data:  Latest Reference Range & Units 12/03/23 09:00  Creatinine 0.61 - 1.24 mg/dL 8.41 (H)     Pertinent Imaging: I have personally viewed and interpreted the RBUS (12/03/2023)-4.1 cm left sidewall bladder mass with internal Doppler flow, concerning for malignancy.  Upper tracts appear morphologically normal, no hydronephrosis.    Assessment & Plan:    Bladder mass Assessment & Plan: 4.1 cm bladder mass via RBUS (Sept 2025) Ongoing GH   I reviewed his clinical history and recent ultrasound imaging.  This is concerning for an underlying bladder cancer.  Recommend TURBT for confirmation diagnosis and histologic staging.  I have reviewed this procedure with him in detail today, including expected perioperative pathway, expected outcomes, recovery and possible  complications.  I reviewed the role for single dose intravesical gemcitabine.  I also reviewed the possibility of left ureteral stent placement if resection involves left ureteral orifice.  - schedule for OR: cystoscopy, TURBT (2-5 cm), possible left  RPG, left ureteral stent placement, intravesical gemcitabine  - UA w/ reflex culture today  - hold ASA 81, 5 days prior to surgery  Orders: -     Ambulatory Referral For Surgery Scheduling      Penne Skye, MD 12/20/2023  [facility] [address], [address] [place], [place] [address] [phone]

## 2023-12-12 NOTE — Assessment & Plan Note (Addendum)
 4.1 cm bladder mass via RBUS (Sept 2025) Ongoing GH   I reviewed his clinical history and recent ultrasound imaging.  This is concerning for an underlying bladder cancer.  Recommend TURBT for confirmation diagnosis and histologic staging.  I have reviewed this procedure with him in detail today, including expected perioperative pathway, expected outcomes, recovery and possible complications.  I reviewed the role for single dose intravesical gemcitabine.  I also reviewed the possibility of left ureteral stent placement if resection involves left ureteral orifice.  - schedule for OR: cystoscopy, TURBT (2-5 cm), possible left RPG, left ureteral stent placement, intravesical gemcitabine  - UA w/ reflex culture today  - hold ASA 81, 5 days prior to surgery

## 2023-12-19 ENCOUNTER — Other Ambulatory Visit: Payer: Self-pay

## 2023-12-19 ENCOUNTER — Ambulatory Visit

## 2023-12-19 ENCOUNTER — Encounter
Admission: RE | Disposition: A | Discharge status: Home / Self Care | Payer: Self-pay | Source: Home / Self Care | Attending: Gastroenterology

## 2023-12-19 ENCOUNTER — Ambulatory Visit
Admission: RE | Admit: 2023-12-19 | Discharge: 2023-12-19 | Disposition: A | Discharge status: Home / Self Care | Attending: Gastroenterology | Admitting: Gastroenterology

## 2023-12-19 ENCOUNTER — Encounter: Payer: Self-pay | Admitting: Gastroenterology

## 2023-12-19 DIAGNOSIS — I10 Essential (primary) hypertension: Secondary | ICD-10-CM | POA: Diagnosis not present

## 2023-12-19 DIAGNOSIS — E119 Type 2 diabetes mellitus without complications: Secondary | ICD-10-CM | POA: Diagnosis not present

## 2023-12-19 DIAGNOSIS — Z8673 Personal history of transient ischemic attack (TIA), and cerebral infarction without residual deficits: Secondary | ICD-10-CM | POA: Insufficient documentation

## 2023-12-19 DIAGNOSIS — Z860100 Personal history of colon polyps, unspecified: Secondary | ICD-10-CM

## 2023-12-19 DIAGNOSIS — K219 Gastro-esophageal reflux disease without esophagitis: Secondary | ICD-10-CM | POA: Diagnosis not present

## 2023-12-19 DIAGNOSIS — K635 Polyp of colon: Secondary | ICD-10-CM | POA: Diagnosis not present

## 2023-12-19 DIAGNOSIS — F17220 Nicotine dependence, chewing tobacco, uncomplicated: Secondary | ICD-10-CM | POA: Diagnosis not present

## 2023-12-19 DIAGNOSIS — K640 First degree hemorrhoids: Secondary | ICD-10-CM | POA: Diagnosis not present

## 2023-12-19 DIAGNOSIS — D123 Benign neoplasm of transverse colon: Secondary | ICD-10-CM | POA: Diagnosis not present

## 2023-12-19 DIAGNOSIS — Z1211 Encounter for screening for malignant neoplasm of colon: Secondary | ICD-10-CM | POA: Diagnosis not present

## 2023-12-19 DIAGNOSIS — N3001 Acute cystitis with hematuria: Secondary | ICD-10-CM

## 2023-12-19 DIAGNOSIS — Z860101 Personal history of adenomatous and serrated colon polyps: Secondary | ICD-10-CM

## 2023-12-19 HISTORY — PX: COLONOSCOPY: SHX5424

## 2023-12-19 HISTORY — PX: POLYPECTOMY: SHX149

## 2023-12-19 LAB — GLUCOSE, CAPILLARY: Glucose-Capillary: 119 mg/dL — ABNORMAL HIGH (ref 70–99)

## 2023-12-19 SURGERY — COLONOSCOPY
Anesthesia: General

## 2023-12-19 MED ORDER — SODIUM CHLORIDE 0.9 % IV SOLN: INTRAVENOUS | Status: DC

## 2023-12-19 MED ORDER — LIDOCAINE HCL (PF) 2 % IJ SOLN
INTRAMUSCULAR | Status: DC | PRN
Start: 2023-12-19 — End: 2023-12-19
  Administered 2023-12-19: 40 mg via INTRADERMAL

## 2023-12-19 MED ORDER — EPHEDRINE SULFATE (PRESSORS) 50 MG/ML IJ SOLN
INTRAMUSCULAR | Status: DC | PRN
  Administered 2023-12-19: 10 mg via INTRAVENOUS

## 2023-12-19 MED ORDER — PROPOFOL 10 MG/ML IV BOLUS
INTRAVENOUS | Status: DC | PRN
Start: 2023-12-19 — End: 2023-12-19
  Administered 2023-12-19 (×4): 20 mg via INTRAVENOUS
  Administered 2023-12-19: 50 mg via INTRAVENOUS
  Administered 2023-12-19 (×2): 20 mg via INTRAVENOUS

## 2023-12-19 NOTE — Transfer of Care (Signed)
 Immediate Anesthesia Transfer of Care Note  Patient: Caleb Davies  Procedure(s) Performed: COLONOSCOPY POLYPECTOMY, INTESTINE  Patient Location: PACU  Anesthesia Type:MAC  Level of Consciousness: awake and alert   Airway & Oxygen Therapy: Patient Spontanous Breathing and Patient connected to nasal cannula oxygen  Post-op Assessment: Report given to RN and Post -op Vital signs reviewed and stable  Post vital signs: Reviewed and stable  Last Vitals:  Vitals Value Taken Time  BP 84/48 12/19/23 09:18  Temp    Pulse 69 12/19/23 09:20  Resp 14 12/19/23 09:20  SpO2 97 % 12/19/23 09:20  Vitals shown include unfiled device data.  Last Pain:  Vitals:   12/19/23 0840  TempSrc: Temporal  PainSc: 0-No pain         Complications: No notable events documented.

## 2023-12-19 NOTE — Anesthesia Postprocedure Evaluation (Signed)
 Anesthesia Post Note  Patient: Caleb Davies  Procedure(s) Performed: COLONOSCOPY POLYPECTOMY, INTESTINE  Patient location during evaluation: Endoscopy Anesthesia Type: General Level of consciousness: awake and alert Pain management: pain level controlled Vital Signs Assessment: post-procedure vital signs reviewed and stable Respiratory status: spontaneous breathing, nonlabored ventilation, respiratory function stable and patient connected to nasal cannula oxygen Cardiovascular status: blood pressure returned to baseline and stable Postop Assessment: no apparent nausea or vomiting Anesthetic complications: no   No notable events documented.   Last Vitals:  Vitals:   12/19/23 0934 12/19/23 0940  BP: (!) 109/56 (!) 114/57  Pulse: 74 71  Resp: 18 (!) 21  Temp: (!) 36 C (!) 35.9 C  SpO2: 100% 100%    Last Pain:  Vitals:   12/19/23 0940  TempSrc: Temporal  PainSc: 0-No pain                 Debby Mines

## 2023-12-19 NOTE — Op Note (Signed)
 [facility]astroenterology Patient Name: Caleb Davies Procedure Date: 12/19/2023 8:49 AM MRN: [id number] Account #: [id number] Date of Birth: [birth year] Admit Type: Outpatient Age: 73 Room: [facility] ENDO ROOM 4 Gender: Male Note Status: Finalized Instrument Name: Colon Scope [id number] Procedure:             Colonoscopy Indications:           High risk colon cancer surveillance: Personal history                         of colonic polyps Providers:             [name] MD, MD Referring MD:          Norleen LOIS Fanning, MD (Referring MD) Medicines:             Propofol  per Anesthesia Complications:         No immediate complications. Procedure:             Pre-Anesthesia Assessment:                        - Prior to the procedure, a History and Physical was                         performed, and patient medications and allergies were                         reviewed. The patient's tolerance of previous                         anesthesia was also reviewed. The risks and benefits                         of the procedure and the sedation options and risks                         were discussed with the patient. All questions were                         answered, and informed consent was obtained. Prior                         Anticoagulants: The patient has taken no anticoagulant                         or antiplatelet agents. ASA Grade Assessment: II - A                         patient with mild systemic disease. After reviewing                         the risks and benefits, the patient was deemed in                         satisfactory condition to undergo the procedure.                        After obtaining informed consent, the colonoscope was  passed under direct vision. Throughout the procedure,                         the patient's blood pressure, pulse, and oxygen                         saturations were monitored continuously. The                          Colonoscope was introduced through the anus and                         advanced to the the terminal ileum. The colonoscopy                         was performed without difficulty. The patient                         tolerated the procedure well. The quality of the bowel                         preparation was excellent. Findings:      The perianal and digital rectal examinations were normal.      Two sessile polyps were found in the transverse colon. The polyps were 2       to 3 mm in size. These polyps were removed with a cold snare. Resection       and retrieval were complete.      A 3 mm polyp was found in the descending colon. The polyp was sessile.       The polyp was removed with a cold snare. Resection and retrieval were       complete.      Non-bleeding internal hemorrhoids were found during retroflexion. The       hemorrhoids were Grade I (internal hemorrhoids that do not prolapse). Impression:            - Two 2 to 3 mm polyps in the transverse colon,                         removed with a cold snare. Resected and retrieved.                        - One 3 mm polyp in the descending colon, removed with                         a cold snare. Resected and retrieved.                        - Non-bleeding internal hemorrhoids. Recommendation:        - Discharge patient to home.                        - Resume previous diet.                        - Continue present medications.                        - Await pathology results.                        -  Repeat colonoscopy is not recommended for                         surveillance. Procedure Code(s):     --- Professional ---                        [phone], Colonoscopy, flexible; with removal of                         tumor(s), polyp(s), or other lesion(s) by snare                         technique Diagnosis Code(s):     --- Professional ---                        Z86.010, Personal history of colonic polyps                         D12.4, Benign neoplasm of descending colon CPT copyright 2022 American Medical Association. All rights reserved. The codes documented in this report are preliminary and upon coder review may  be revised to meet current compliance requirements. [name] MD, MD 12/19/2023 9:17:18 AM This report has been signed electronically. Number of Addenda: 0 Note Initiated On: 12/19/2023 8:49 AM Scope Withdrawal Time: 0 hours 8 minutes 47 seconds  Total Procedure Duration: 0 hours 14 minutes 59 seconds  Estimated Blood Loss:  Estimated blood loss: none.      [facility]

## 2023-12-19 NOTE — H&P (Signed)
 Caleb Copping, MD [facility] [address] [place], [place] [address] Phone:[phone] Fax : [phone]  Primary Care Physician:  Amadeo Rush, MD Primary Gastroenterologist:  Dr. Copping  Pre-Procedure History & Physical: HPI:  Caleb Davies is a 73 y.o. male is here for an colonoscopy.   Past Medical History:  Diagnosis Date   Diabetes mellitus without complication (HCC)    Hypertension    Stroke [facility]) 2009    Past Surgical History:  Procedure Laterality Date   ANTERIOR CERVICAL DECOMP/DISCECTOMY FUSION  2014   C4-7   COLONOSCOPY N/A 09/2013   repeat due 09/2018   COLONOSCOPY WITH PROPOFOL  N/A 10/29/2018   Procedure: COLONOSCOPY WITH BIOPSY;  Surgeon: Davies Rogelia, MD;  Location: [facility];  Service: Endoscopy;  Laterality: N/A;  DIABETIC doesnt want arrival time earlier than 0730   ESOPHAGEAL DILATION N/A 10/29/2018   Procedure: ESOPHAGEAL DILATION;  Surgeon: Davies Rogelia, MD;  Location: [facility];  Service: Endoscopy;  Laterality: N/A;   ESOPHAGOGASTRODUODENOSCOPY N/A 09/2013   ESOPHAGOGASTRODUODENOSCOPY (EGD) WITH PROPOFOL  N/A 10/29/2018   Procedure: ESOPHAGOGASTRODUODENOSCOPY (EGD) WITH DILATION;  Surgeon: Davies Rogelia, MD;  Location: [facility];  Service: Endoscopy;  Laterality: N/A;   POLYPECTOMY N/A 10/29/2018   Procedure: POLYPECTOMY;  Surgeon: Davies Rogelia, MD;  Location: [facility];  Service: Endoscopy;  Laterality: N/A;    Prior to Admission medications   Medication Sig Start Date End Date Taking? Authorizing Provider  amLODipine (NORVASC) 2.5 MG tablet Take 5 mg by mouth daily.  09/12/18  Yes [provider]  aspirin 81 MG chewable tablet Chew by mouth daily.   Yes [provider]  atorvastatin (LIPITOR) 40 MG tablet Take 40 mg by mouth daily. 09/11/18  Yes [provider]  losartan (COZAAR) 100 MG tablet Take 100 mg by mouth daily. 09/29/18  Yes [provider]  metoprolol succinate  (TOPROL-XL) 25 MG 24 hr tablet Take 25 mg by mouth daily.   Yes [provider]  olmesartan (BENICAR) 40 MG tablet Take 40 mg by mouth daily. 08/25/23  Yes [provider]  omeprazole (PRILOSEC) 40 MG capsule Take 40 mg by mouth.   Yes [provider]  cephALEXin (KEFLEX) 500 MG capsule TAKE 1 CAPSULE (500 MG TOTAL) BY MOUTH THREE (3) TIMES A DAY FOR 14 DAYS. 11/26/20   [provider]  glucose blood (CONTOUR NEXT TEST) test strip by Other route. 12/31/21   [provider]  metFORMIN (GLUCOPHAGE-XR) 750 MG 24 hr tablet Take 750 mg by mouth. Patient not taking: Reported on 12/19/2023 02/17/23 02/17/24  [provider]  pantoprazole (PROTONIX) 40 MG tablet TAKE 1 TABLET BY MOUTH EVERY DAY 07/06/17   [provider]    Allergies as of 09/21/2023 - Review Complete 09/21/2023  Allergen Reaction Noted   Trazodone Swelling 08/03/2012   Hydrochlorothiazide Other (See Comments) 08/03/2012   Codeine Nausea Only 08/03/2012    History reviewed. No pertinent family history.  Social History   Socioeconomic History   Marital status: Married    Spouse name: Not on file   Number of children: Not on file   Years of education: Not on file   Highest education level: Not on file  Occupational History   Not on file  Tobacco Use   Smoking status: Former    Current packs/day: 0.00    Average packs/day: 1 pack/day for 40.0 years (40.0 ttl pk-yrs)    Types: Cigarettes    Start date: 74  Quit date: 2009    Years since quitting: 16.7   Smokeless tobacco: Current    Types: Chew  Substance and Sexual Activity   Alcohol use: Not Currently   Drug use: Never   Sexual activity: Not on file  Other Topics Concern   Not on file  Social History Narrative   Not on file   Social Drivers of Health   Financial Resource Strain: Low Risk  (05/18/2023)   Received from [facility]   Overall Financial Resource Strain (CARDIA)    Difficulty of  Paying Living Expenses: Not hard at all  Food Insecurity: No Food Insecurity (06/30/2023)   Received from [facility]   Hunger Vital Sign    Within the past 12 months, you worried that your food would run out before you got the money to buy more.: Never true    Within the past 12 months, the food you bought just didn't last and you didn't have money to get more.: Never true  Transportation Needs: No Transportation Needs (06/30/2023)   Received from [facility]   PRAPARE - Transportation    Lack of Transportation (Medical): No    Lack of Transportation (Non-Medical): No  Physical Activity: Not on file  Stress: Not on file  Social Connections: Not on file  Intimate Partner Violence: Not on file    Review of Systems: See HPI, otherwise negative ROS  Physical Exam: BP 128/69   Pulse 60   Temp (!) 96.6 F (35.9 C) (Temporal)   Resp 16   Ht 5' 10 (1.778 m)   Wt 89.4 kg   SpO2 99%   BMI 28.27 kg/m  General:   Alert,  pleasant and cooperative in NAD Head:  Normocephalic and atraumatic. Neck:  Supple; no masses or thyromegaly. Lungs:  Clear throughout to auscultation.    Heart:  Regular rate and rhythm. Abdomen:  Soft, nontender and nondistended. Normal bowel sounds, without guarding, and without rebound.   Neurologic:  Alert and  oriented x4;  grossly normal neurologically.  Impression/Plan: Caleb Davies is here for an colonoscopy to be performed for a history of adenomatous polyps on 2020   Risks, benefits, limitations, and alternatives regarding  colonoscopy have been reviewed with the patient.  Questions have been answered.  All parties agreeable.   Caleb Copping, MD  12/19/2023, 8:45 AM

## 2023-12-19 NOTE — Anesthesia Preprocedure Evaluation (Signed)
 Anesthesia Evaluation  Patient identified by MRN, date of birth, ID band Patient awake    Reviewed: Allergy & Precautions, NPO status , Patient's Chart, lab work & pertinent test results  History of Anesthesia Complications Negative for: history of anesthetic complications  Airway Mallampati: II  TM Distance: >3 FB Neck ROM: Full    Dental  (+) Lower Dentures, Upper Dentures   Pulmonary neg pulmonary ROS, former smoker   Pulmonary exam normal breath sounds clear to auscultation       Cardiovascular hypertension, negative cardio ROS Normal cardiovascular exam Rhythm:Regular Rate:Normal     Neuro/Psych CVA (2009), No Residual Symptoms negative neurological ROS  negative psych ROS   GI/Hepatic negative GI ROS, Neg liver ROS,GERD  ,,  Endo/Other  negative endocrine ROSdiabetes, Type 2    Renal/GU negative Renal ROS  negative genitourinary   Musculoskeletal   Abdominal   Peds  Hematology negative hematology ROS (+)   Anesthesia Other Findings Past Medical History: No date: Diabetes mellitus without complication (HCC) No date: Hypertension 2009: Stroke [facility])  Past Surgical History: 2014: ANTERIOR CERVICAL DECOMP/DISCECTOMY FUSION     Comment:  C4-7 09/2013: COLONOSCOPY; N/A     Comment:  repeat due 09/2018 10/29/2018: COLONOSCOPY WITH PROPOFOL ; N/A     Comment:  Procedure: COLONOSCOPY WITH BIOPSY;  Surgeon: Jinny Carmine, MD;  Location: [facility];  Service:               Endoscopy;  Laterality: N/A;  DIABETIC doesnt want               arrival time earlier than 0730 10/29/2018: ESOPHAGEAL DILATION; N/A     Comment:  Procedure: ESOPHAGEAL DILATION;  Surgeon: Jinny Carmine,               MD;  Location: [facility];  Service: Endoscopy;               Laterality: N/A; 09/2013: ESOPHAGOGASTRODUODENOSCOPY; N/A 10/29/2018: ESOPHAGOGASTRODUODENOSCOPY (EGD) WITH PROPOFOL ; N/A     Comment:   Procedure: ESOPHAGOGASTRODUODENOSCOPY (EGD) WITH               DILATION;  Surgeon: Jinny Carmine, MD;  Location: [facility]               SURGERY CNTR;  Service: Endoscopy;  Laterality: N/A; 10/29/2018: POLYPECTOMY; N/A     Comment:  Procedure: POLYPECTOMY;  Surgeon: Jinny Carmine, MD;                Location: [facility];  Service: Endoscopy;                Laterality: N/A;  BMI    Body Mass Index: 28.27 kg/m      Reproductive/Obstetrics negative OB ROS                              Anesthesia Physical Anesthesia Plan  ASA: 3  Anesthesia Plan: General   Post-op Pain Management: Minimal or no pain anticipated   Induction: Intravenous  PONV Risk Score and Plan: 2 and Propofol  infusion and TIVA  Airway Management Planned: Nasal Cannula  Additional Equipment: None  Intra-op Plan:   Post-operative Plan:   Informed Consent: I have reviewed the patients History and Physical, chart, labs and discussed the procedure including the risks, benefits and alternatives for the proposed anesthesia with the patient  or authorized representative who has indicated his/her understanding and acceptance.     Dental advisory given  Plan Discussed with: CRNA and Surgeon  Anesthesia Plan Comments: (Discussed risks of anesthesia with patient, including possibility of difficulty with spontaneous ventilation under anesthesia necessitating airway intervention, PONV, and rare risks such as cardiac or respiratory or neurological events, and allergic reactions. Discussed the role of CRNA in patient's perioperative care. Patient understands.)        Anesthesia Quick Evaluation

## 2023-12-20 ENCOUNTER — Other Ambulatory Visit
Admission: RE | Admit: 2023-12-20 | Discharge: 2023-12-20 | Disposition: A | Discharge status: Home / Self Care | Attending: Urology | Admitting: Urology

## 2023-12-20 ENCOUNTER — Ambulatory Visit: Admitting: Urology

## 2023-12-20 ENCOUNTER — Other Ambulatory Visit: Payer: Self-pay

## 2023-12-20 ENCOUNTER — Telehealth: Payer: Self-pay

## 2023-12-20 VITALS — BP 138/80 | HR 84 | Wt 197.0 lb

## 2023-12-20 DIAGNOSIS — N3001 Acute cystitis with hematuria: Secondary | ICD-10-CM | POA: Diagnosis present

## 2023-12-20 DIAGNOSIS — D494 Neoplasm of unspecified behavior of bladder: Secondary | ICD-10-CM

## 2023-12-20 DIAGNOSIS — N3289 Other specified disorders of bladder: Secondary | ICD-10-CM | POA: Diagnosis not present

## 2023-12-20 LAB — URINALYSIS, COMPLETE (UACMP) WITH MICROSCOPIC
Bilirubin Urine: NEGATIVE
Glucose, UA: NEGATIVE mg/dL
Ketones, ur: NEGATIVE mg/dL
Leukocytes,Ua: NEGATIVE
Nitrite: NEGATIVE
Protein, ur: 30 mg/dL — AB
Specific Gravity, Urine: 1.025 (ref 1.005–1.030)
pH: 5.5 (ref 5.0–8.0)

## 2023-12-20 LAB — SURGICAL PATHOLOGY

## 2023-12-20 NOTE — Telephone Encounter (Signed)
 Per Dr. Georganne, Patient is to be scheduled for Diagnostic Cystoscopy, Transurethral Resection of Bladder Tumor, Possible Left Retrograde Pyelogram, Left Ureteral Stent Placement and Intravesical Instillation of Gemcitabine   Caleb Davies was contacted and possible surgical dates were discussed, Tuesday October 21st, 2025 was agreed upon for surgery.   Patient was directed to call [phone] between 1-3pm the day before surgery to find out surgical arrival time.  Instructions were given not to eat or drink from midnight on the night before surgery and have a driver for the day of surgery. On the surgery day patient was instructed to enter through the Medical Mall entrance of [facility] report the Same Day Surgery desk.   Pre-Admit Testing will be in contact via phone to set up an interview with the anesthesia team to review your history and medications prior to surgery.   Reminder of this information was sent via Mail to the patient.

## 2023-12-20 NOTE — Progress Notes (Signed)
   [facility] Surgical Posting Form  Surgery Date: Date: 01/02/2024  Surgeon: Dr. Penne Skye, MD  Inpt ( No  )   Outpt (Yes)   Obs ( No  )   Diagnosis: D49.4 Bladder Tumor  -CPT: 52000, 52234, 74420, 47667, 51720  Surgery: Diagnostic Cystoscopy, Transurethral Resection of Bladder Tumor, Possible Left Retrograde Pyelogram, Left Ureteral Stent Placement and Intravesical Instillation of Gemcitabine  Stop Anticoagulations: Yes, hold ASA for 5 Days prior to surgery  Cardiac/Medical/Pulmonary Clearance needed: No  *Orders entered into EPIC  Date: 12/20/23   *Case booked in EPIC  Date: 12/20/23  *Notified pt of Surgery: Date: 12/20/23  PRE-OP UA & CX: Yes, obtained while in clinic on 12/20/2023  *Placed into Prior Authorization Work [place] Date: 12/20/23  Assistant/laser/rep:No

## 2023-12-20 NOTE — Progress Notes (Signed)
 Surgical Physician Order Form [facility]  Dr. Penne Skye, MD  * Scheduling expectation : Next Available  *Length of Case: 45 min  *Clearance needed: no  *Anticoagulation Instructions: N/A  *Aspirin Instructions: Hold Aspirin  *Post-op visit Date/Instructions:  1-2 week follow up  *Diagnosis: Bladder Tumor  *Procedure: cystoscopy, TURBT (2-5 cm), possible left RPG, left ureteral stent placement, intravesical gemcitabine    Additional orders: Gemcitabine 2000mg  bladder instillation  -Admit type: OUTpatient  -Anesthesia: General  -VTE Prophylaxis Standing Order SCD's       Other:   -Standing Lab Orders Per Anesthesia    Lab other: UA&Urine Culture  -Standing Test orders EKG/Chest x-ray per Anesthesia       Test other:   - Medications:  Ancef 2gm IV  -Other orders:  N/A

## 2023-12-21 LAB — URINE CULTURE: Culture: <10000 — AB

## 2023-12-25 ENCOUNTER — Ambulatory Visit: Payer: Self-pay | Admitting: Gastroenterology

## 2023-12-27 ENCOUNTER — Other Ambulatory Visit: Payer: Self-pay

## 2023-12-27 ENCOUNTER — Encounter
Admission: RE | Admit: 2023-12-27 | Discharge: 2023-12-27 | Disposition: A | Discharge status: Home / Self Care | Source: Ambulatory Visit | Attending: Urology | Admitting: Urology

## 2023-12-27 VITALS — Ht 70.0 in | Wt 197.0 lb

## 2023-12-27 DIAGNOSIS — E119 Type 2 diabetes mellitus without complications: Secondary | ICD-10-CM

## 2023-12-27 DIAGNOSIS — Z01810 Encounter for preprocedural cardiovascular examination: Secondary | ICD-10-CM

## 2023-12-27 DIAGNOSIS — I10 Essential (primary) hypertension: Secondary | ICD-10-CM

## 2023-12-27 DIAGNOSIS — Z01812 Encounter for preprocedural laboratory examination: Secondary | ICD-10-CM

## 2023-12-27 HISTORY — DX: Dysphagia, unspecified: R13.10

## 2023-12-27 HISTORY — DX: Esophageal obstruction: K22.2

## 2023-12-27 HISTORY — DX: Gastro-esophageal reflux disease without esophagitis: K21.9

## 2023-12-27 HISTORY — DX: Polyp of colon: K63.5

## 2023-12-27 NOTE — Patient Instructions (Addendum)
 Your procedure is scheduled on:   TUESDAY OCTOBER 21  Report to the Registration Desk on the 1st floor of the [facility] To find out your arrival time, please call [phone] between 1PM - 3PM on:   MONDAY  OCTOBER 20  If your arrival time is 6:00 am, do not arrive before that time as the Medical Mall entrance doors do not open until 6:00 am.  REMEMBER: Instructions that are not followed completely may result in serious medical risk, up to and including death; or upon the discretion of your surgeon and anesthesiologist your surgery may need to be rescheduled.  Do not eat food after midnight the night before surgery.  No gum chewing or hard candies.  One week prior to surgery:  STARTING TUESDAY OCTOBER 14  Stop Anti-inflammatories (NSAIDS) such as Advil, Aleve, Ibuprofen, Motrin, Naproxen, Naprosyn and Aspirin based products such as Excedrin, Goody's Powder, BC Powder. Stop ANY OVER THE COUNTER supplements until after surgery.  You may however, continue to take Tylenol  if needed for pain up until the day of surgery.  **Follow guidelines for insulin and diabetes medications.** metFORMIN (GLUCOPHAGE-XR) hold 2 days prior to surgery, last dose SATURDAY OCTOBER 18   Continue taking all of your other prescription medications up until the day of surgery.  ON THE DAY OF SURGERY ONLY TAKE THESE MEDS WITH A SIP OF WATER : metoprolol succinate (TOPROL-XL  pantoprazole (PROTONIX)   No Alcohol for 24 hours before or after surgery.  No chewable tobacco products for at least 6 hours before surgery.   Do not use any recreational drugs for at least a week (preferably 2 weeks) before your surgery.  Please be advised that the combination of cocaine and anesthesia may have negative outcomes, up to and including death. If you test positive for cocaine, your surgery will be cancelled.  On the morning of surgery brush your teeth with toothpaste and water , you may rinse your mouth with mouthwash if  you wish. Do not swallow any toothpaste or mouthwash.  Do not wear jewelry, make-up, hairpins, clips or nail polish.  For welded (permanent) jewelry: bracelets, anklets, waist bands, etc.  Please have this removed prior to surgery.  If it is not removed, there is a chance that hospital personnel will need to cut it off on the day of surgery.  Do not wear lotions, powders, or perfumes.   Do not shave body hair from the neck down 48 hours before surgery.  Contact lenses, hearing aids and dentures may not be worn into surgery.  Do not bring valuables to the hospital. [facility] is not responsible for any missing/lost belongings or valuables.   Notify your doctor if there is any change in your medical condition (cold, fever, infection).  Wear comfortable clothing (specific to your surgery type) to the hospital.  After surgery, you can help prevent lung complications by doing breathing exercises.  Take deep breaths and cough every 1-2 hours.   If you are being discharged the day of surgery, you will not be allowed to drive home. You will need a responsible individual to drive you home and stay with you for 24 hours after surgery.   If you are taking public transportation, you will need to have a responsible individual with you.  Please call the Pre-admissions Testing Dept. at [phone] if you have any questions about these instructions.  Surgery Visitation Policy:  Patients having surgery or a procedure may have two visitors.  Children under the age  of 16 must have an adult with them who is not the patient.  Merchandiser, retail to address health-related social needs:  [email or web address]

## 2023-12-28 ENCOUNTER — Encounter
Admission: RE | Admit: 2023-12-28 | Discharge: 2023-12-28 | Disposition: A | Discharge status: Home / Self Care | Source: Ambulatory Visit | Attending: Urology | Admitting: Urology

## 2023-12-28 ENCOUNTER — Other Ambulatory Visit: Payer: Self-pay

## 2023-12-28 DIAGNOSIS — I10 Essential (primary) hypertension: Secondary | ICD-10-CM | POA: Insufficient documentation

## 2023-12-28 DIAGNOSIS — Z01810 Encounter for preprocedural cardiovascular examination: Secondary | ICD-10-CM | POA: Diagnosis present

## 2024-01-02 ENCOUNTER — Encounter
Admission: RE | Disposition: A | Discharge status: Home / Self Care | Payer: Self-pay | Source: Home / Self Care | Attending: Urology

## 2024-01-02 ENCOUNTER — Ambulatory Visit

## 2024-01-02 ENCOUNTER — Other Ambulatory Visit: Payer: Self-pay

## 2024-01-02 ENCOUNTER — Ambulatory Visit
Admission: RE | Admit: 2024-01-02 | Discharge: 2024-01-02 | Disposition: A | Discharge status: Home / Self Care | Attending: Urology | Admitting: Urology

## 2024-01-02 ENCOUNTER — Encounter: Payer: Self-pay | Admitting: Urology

## 2024-01-02 ENCOUNTER — Ambulatory Visit: Payer: Self-pay | Admitting: Urgent Care

## 2024-01-02 DIAGNOSIS — Z01810 Encounter for preprocedural cardiovascular examination: Secondary | ICD-10-CM

## 2024-01-02 DIAGNOSIS — I10 Essential (primary) hypertension: Secondary | ICD-10-CM | POA: Insufficient documentation

## 2024-01-02 DIAGNOSIS — D494 Neoplasm of unspecified behavior of bladder: Secondary | ICD-10-CM | POA: Diagnosis present

## 2024-01-02 DIAGNOSIS — C679 Malignant neoplasm of bladder, unspecified: Secondary | ICD-10-CM

## 2024-01-02 DIAGNOSIS — C678 Malignant neoplasm of overlapping sites of bladder: Secondary | ICD-10-CM | POA: Diagnosis not present

## 2024-01-02 DIAGNOSIS — K219 Gastro-esophageal reflux disease without esophagitis: Secondary | ICD-10-CM | POA: Diagnosis not present

## 2024-01-02 DIAGNOSIS — Z8673 Personal history of transient ischemic attack (TIA), and cerebral infarction without residual deficits: Secondary | ICD-10-CM | POA: Insufficient documentation

## 2024-01-02 DIAGNOSIS — E119 Type 2 diabetes mellitus without complications: Secondary | ICD-10-CM | POA: Diagnosis not present

## 2024-01-02 DIAGNOSIS — Z87891 Personal history of nicotine dependence: Secondary | ICD-10-CM | POA: Insufficient documentation

## 2024-01-02 DIAGNOSIS — N3289 Other specified disorders of bladder: Secondary | ICD-10-CM

## 2024-01-02 DIAGNOSIS — Z01812 Encounter for preprocedural laboratory examination: Secondary | ICD-10-CM

## 2024-01-02 HISTORY — PX: CYSTOSCOPY W/ URETERAL STENT PLACEMENT: SHX1429

## 2024-01-02 HISTORY — PX: BLADDER INSTILLATION: SHX6893

## 2024-01-02 HISTORY — PX: TRANSURETHRAL RESECTION OF BLADDER TUMOR: SHX2575

## 2024-01-02 LAB — GLUCOSE, CAPILLARY: Glucose-Capillary: 123 mg/dL — ABNORMAL HIGH (ref 70–99)

## 2024-01-02 SURGERY — TURBT (TRANSURETHRAL RESECTION OF BLADDER TUMOR)
Anesthesia: General | Site: Ureter

## 2024-01-02 MED ORDER — ONDANSETRON HCL 4 MG/2ML IJ SOLN
INTRAMUSCULAR | Status: DC | PRN
Start: 2024-01-02 — End: 2024-01-02
  Administered 2024-01-02: 4 mg via INTRAVENOUS

## 2024-01-02 MED ORDER — GEMCITABINE CHEMO FOR BLADDER INSTILLATION 2000 MG
2000.0000 mg | Freq: Once | INTRAVENOUS | Status: DC
  Filled 2024-01-02: qty 52.6

## 2024-01-02 MED ORDER — SODIUM CHLORIDE 0.9 % IR SOLN
Status: DC | PRN
  Administered 2024-01-02 (×4): 3000 mL

## 2024-01-02 MED ORDER — ACETAMINOPHEN 10 MG/ML IV SOLN
INTRAVENOUS | Status: AC
  Filled 2024-01-02: qty 100

## 2024-01-02 MED ORDER — CHLORHEXIDINE GLUCONATE 0.12 % MT SOLN
15.0000 mL | Freq: Once | OROMUCOSAL | Status: AC
  Administered 2024-01-02: 15 mL via OROMUCOSAL

## 2024-01-02 MED ORDER — ORAL CARE MOUTH RINSE: 15.0000 mL | Freq: Once | OROMUCOSAL | Status: AC

## 2024-01-02 MED ORDER — FLUORESCEIN SODIUM 10 % IV SOLN
INTRAVENOUS | Status: DC | PRN
Start: 2024-01-02 — End: 2024-01-02
  Administered 2024-01-02: 25 mg via INTRAVENOUS

## 2024-01-02 MED ORDER — FENTANYL CITRATE (PF) 100 MCG/2ML IJ SOLN
25.0000 ug | INTRAMUSCULAR | Status: DC | PRN
  Administered 2024-01-02: 25 ug via INTRAVENOUS

## 2024-01-02 MED ORDER — ACETAMINOPHEN 10 MG/ML IV SOLN
1000.0000 mg | Freq: Once | INTRAVENOUS | Status: DC | PRN
  Administered 2024-01-02: 1000 mg via INTRAVENOUS

## 2024-01-02 MED ORDER — PHENYLEPHRINE 80 MCG/ML (10ML) SYRINGE FOR IV PUSH (FOR BLOOD PRESSURE SUPPORT)
PREFILLED_SYRINGE | INTRAVENOUS | Status: DC | PRN
  Administered 2024-01-02 (×2): 80 ug via INTRAVENOUS

## 2024-01-02 MED ORDER — OXYCODONE HCL 5 MG/5ML PO SOLN: 5.0000 mg | Freq: Once | ORAL | Status: DC | PRN

## 2024-01-02 MED ORDER — PROPOFOL 1000 MG/100ML IV EMUL
INTRAVENOUS | Status: AC
  Filled 2024-01-02: qty 100

## 2024-01-02 MED ORDER — OXYCODONE HCL 5 MG PO TABS: 5.0000 mg | ORAL_TABLET | Freq: Once | ORAL | Status: DC | PRN

## 2024-01-02 MED ORDER — DROPERIDOL 2.5 MG/ML IJ SOLN: 0.6250 mg | Freq: Once | INTRAMUSCULAR | Status: DC | PRN

## 2024-01-02 MED ORDER — FENTANYL CITRATE (PF) 100 MCG/2ML IJ SOLN
INTRAMUSCULAR | Status: DC | PRN
  Administered 2024-01-02: 50 ug via INTRAVENOUS

## 2024-01-02 MED ORDER — LIDOCAINE HCL (PF) 2 % IJ SOLN
INTRAMUSCULAR | Status: AC
  Filled 2024-01-02: qty 5

## 2024-01-02 MED ORDER — GEMCITABINE CHEMO FOR BLADDER INSTILLATION 2000 MG
INTRAVENOUS | Status: DC | PRN
  Administered 2024-01-02: 2000 mg via INTRAVESICAL

## 2024-01-02 MED ORDER — FLUORESCEIN SODIUM 10 % IV SOLN
500.0000 mg | Freq: Once | INTRAVENOUS | Status: DC
Start: 2024-01-02 — End: 2024-01-02

## 2024-01-02 MED ORDER — ROCURONIUM BROMIDE 10 MG/ML (PF) SYRINGE
PREFILLED_SYRINGE | INTRAVENOUS | Status: AC
  Filled 2024-01-02: qty 10

## 2024-01-02 MED ORDER — ROCURONIUM BROMIDE 100 MG/10ML IV SOLN
INTRAVENOUS | Status: DC | PRN
  Administered 2024-01-02: 50 mg via INTRAVENOUS

## 2024-01-02 MED ORDER — OXYBUTYNIN CHLORIDE ER 5 MG PO TB24: 5.0000 mg | ORAL_TABLET | Freq: Every day | ORAL | 0 refills | Status: DC

## 2024-01-02 MED ORDER — PHENYLEPHRINE 80 MCG/ML (10ML) SYRINGE FOR IV PUSH (FOR BLOOD PRESSURE SUPPORT)
PREFILLED_SYRINGE | INTRAVENOUS | Status: AC
  Filled 2024-01-02: qty 10

## 2024-01-02 MED ORDER — CEFAZOLIN SODIUM-DEXTROSE 2-4 GM/100ML-% IV SOLN
2.0000 g | INTRAVENOUS | Status: AC
  Administered 2024-01-02: 2 g via INTRAVENOUS

## 2024-01-02 MED ORDER — STERILE WATER FOR IRRIGATION IR SOLN
Status: DC | PRN
  Administered 2024-01-02: 500 mL

## 2024-01-02 MED ORDER — IOHEXOL 180 MG/ML  SOLN
INTRAMUSCULAR | Status: DC | PRN
  Administered 2024-01-02: 10 mL

## 2024-01-02 MED ORDER — PROPOFOL 10 MG/ML IV BOLUS
INTRAVENOUS | Status: DC | PRN
  Administered 2024-01-02: 100 ug/kg/min via INTRAVENOUS
  Administered 2024-01-02: 150 mg via INTRAVENOUS

## 2024-01-02 MED ORDER — LIDOCAINE HCL (CARDIAC) PF 100 MG/5ML IV SOSY
PREFILLED_SYRINGE | INTRAVENOUS | Status: DC | PRN
  Administered 2024-01-02: 100 mg via INTRAVENOUS

## 2024-01-02 MED ORDER — SUGAMMADEX SODIUM 200 MG/2ML IV SOLN
INTRAVENOUS | Status: DC | PRN
  Administered 2024-01-02: 200 mg via INTRAVENOUS

## 2024-01-02 MED ORDER — MIDAZOLAM HCL (PF) 2 MG/2ML IJ SOLN
INTRAMUSCULAR | Status: DC | PRN
  Administered 2024-01-02: 2 mg via INTRAVENOUS

## 2024-01-02 MED ORDER — CHLORHEXIDINE GLUCONATE 0.12 % MT SOLN
OROMUCOSAL | Status: AC
  Filled 2024-01-02: qty 15

## 2024-01-02 MED ORDER — CEFAZOLIN SODIUM-DEXTROSE 2-4 GM/100ML-% IV SOLN
INTRAVENOUS | Status: AC
  Filled 2024-01-02: qty 100

## 2024-01-02 MED ORDER — FENTANYL CITRATE (PF) 100 MCG/2ML IJ SOLN
INTRAMUSCULAR | Status: AC
  Filled 2024-01-02: qty 2

## 2024-01-02 MED ORDER — ONDANSETRON HCL 4 MG/2ML IJ SOLN
INTRAMUSCULAR | Status: AC
  Filled 2024-01-02: qty 2

## 2024-01-02 MED ORDER — FLUORESCEIN SODIUM 10 % IV SOLN
INTRAVENOUS | Status: AC
  Filled 2024-01-02: qty 5

## 2024-01-02 MED ORDER — SODIUM CHLORIDE 0.9 % IV SOLN: INTRAVENOUS | Status: DC

## 2024-01-02 MED ORDER — PHENAZOPYRIDINE HCL 200 MG PO TABS: 200.0000 mg | ORAL_TABLET | Freq: Three times a day (TID) | ORAL | 0 refills | Status: AC | PRN

## 2024-01-02 MED ORDER — DEXAMETHASONE SOD PHOSPHATE PF 10 MG/ML IJ SOLN
INTRAMUSCULAR | Status: DC | PRN
Start: 2024-01-02 — End: 2024-01-02
  Administered 2024-01-02: 10 mg via INTRAVENOUS

## 2024-01-02 MED ORDER — TAMSULOSIN HCL 0.4 MG PO CAPS: 0.4000 mg | ORAL_CAPSULE | Freq: Every day | ORAL | 0 refills | Status: DC

## 2024-01-02 MED ORDER — MIDAZOLAM HCL 2 MG/2ML IJ SOLN
INTRAMUSCULAR | Status: AC
  Filled 2024-01-02: qty 2

## 2024-01-02 SURGICAL SUPPLY — 27 items
BAG DRAIN SIEMENS DORNER NS (MISCELLANEOUS) ×4 IMPLANT
BAG URINE DRAIN 2000ML AR STRL (UROLOGICAL SUPPLIES) ×4 IMPLANT
BRUSH SCRUB EZ 4% CHG (MISCELLANEOUS) ×4 IMPLANT
CATH FOLEY 2WAY SIL 18X30 (CATHETERS) IMPLANT
CATH URETL OPEN 5X70 (CATHETERS) ×4 IMPLANT
CATH URTH 16FR FL 2W BLN LF (CATHETERS) ×2 IMPLANT
DRAPE UTILITY 15X26 TOWEL STRL (DRAPES) ×4 IMPLANT
DRSG TELFA 3X4 N-ADH STERILE (GAUZE/BANDAGES/DRESSINGS) ×4 IMPLANT
ELECTRODE LOOP 22F BIPOLAR SML (ELECTROSURGICAL) IMPLANT
ELECTRODE REM PT RTRN 9FT ADLT (ELECTROSURGICAL) IMPLANT
GLOVE BIOGEL PI IND STRL 7.0 (GLOVE) ×6 IMPLANT
GOWN STRL REUS W/ TWL LRG LVL3 (GOWN DISPOSABLE) ×8 IMPLANT
GUIDEWIRE STR DUAL SENSOR (WIRE) ×4 IMPLANT
KIT TURNOVER CYSTO (KITS) ×4 IMPLANT
LOOP CUT BIPOLAR 24F LRG (ELECTROSURGICAL) ×2 IMPLANT
NDL SAFETY ECLIP 18X1.5 (MISCELLANEOUS) ×2 IMPLANT
PACK CYSTO AR (MISCELLANEOUS) ×4 IMPLANT
PAD ARMBOARD POSITIONER FOAM (MISCELLANEOUS) ×4 IMPLANT
SET CYSTO IRRIGATION (SET/KITS/TRAYS/PACK) ×2 IMPLANT
SET IRRIG Y TYPE TUR BLADDER L (SET/KITS/TRAYS/PACK) ×4 IMPLANT
SOL .9 NS 3000ML IRR UROMATIC (IV SOLUTION) ×12 IMPLANT
SOLN STERILE WATER BTL 1000 ML (IV SOLUTION) ×2 IMPLANT
STENT URET 6FRX24 CONTOUR (STENTS) ×2 IMPLANT
STENT URET 6FRX26 CONTOUR (STENTS) IMPLANT
SURGILUBE 2OZ TUBE FLIPTOP (MISCELLANEOUS) ×4 IMPLANT
SYRINGE TOOMEY IRRIG 70ML (MISCELLANEOUS) ×4 IMPLANT
WATER STERILE IRR 500ML POUR (IV SOLUTION) ×4 IMPLANT

## 2024-01-02 NOTE — Anesthesia Postprocedure Evaluation (Signed)
 Anesthesia Post Note  Patient: Kelan Pritt.  Procedure(s) Performed: TURBT (TRANSURETHRAL RESECTION OF BLADDER TUMOR) (Bladder) INSTILLATION, BLADDER (Bladder) CYSTOSCOPY, WITH RETROGRADE PYELOGRAM AND URETERAL STENT INSERTION (Left: Ureter)  Patient location during evaluation: PACU Anesthesia Type: General Level of consciousness: awake and alert Pain management: pain level controlled Vital Signs Assessment: post-procedure vital signs reviewed and stable Respiratory status: spontaneous breathing, nonlabored ventilation, respiratory function stable and patient connected to nasal cannula oxygen Cardiovascular status: blood pressure returned to baseline and stable Postop Assessment: no apparent nausea or vomiting Anesthetic complications: no   There were no known notable events for this encounter.   Last Vitals:  Vitals:   01/02/24 1615 01/02/24 1653  BP: (!) 122/58 112/68  Pulse: (!) 55 66  Resp: 16 18  Temp:  (!) 36.3 C  SpO2: 98% 98%    Last Pain:  Vitals:   01/02/24 1653  TempSrc: Temporal  PainSc: 0-No pain                 Lynwood KANDICE Clause

## 2024-01-02 NOTE — Progress Notes (Signed)
 Patient gemcitabine unclamped per care order.

## 2024-01-02 NOTE — Interval H&P Note (Signed)
 History and Physical Interval Note:  01/02/2024 1:59 PM  Caleb Davies.  has presented today for surgery, with the diagnosis of Bladder Tumor.  The various methods of treatment have been discussed with the patient and family. After consideration of risks, benefits and other options for treatment, the patient has consented to  Procedure(s) with comments: TURBT (TRANSURETHRAL RESECTION OF BLADDER TUMOR) (N/A) INSTILLATION, BLADDER (N/A) - Gemcitabine CYSTOSCOPY, WITH RETROGRADE PYELOGRAM AND URETERAL STENT INSERTION (Left) as a surgical intervention.  The patient's history has been reviewed, patient examined, no change in status, stable for surgery.  I have reviewed the patient's chart and labs.  Questions were answered to the patient's satisfaction.    Cardiac: RRR Lungs: CTA bilaterally  Laterality: N/A, possible Left ureteral stent placement Procedure: TURBT + intravesical gemcitabine instillation   Caleb JONELLE Skye, MD 01/02/2024   Caleb Davies

## 2024-01-02 NOTE — Transfer of Care (Addendum)
 Immediate Anesthesia Transfer of Care Note  Patient: Caleb Davies.  Procedure(s) Performed: TURBT (TRANSURETHRAL RESECTION OF BLADDER TUMOR) (Bladder) INSTILLATION, BLADDER (Bladder) CYSTOSCOPY, WITH RETROGRADE PYELOGRAM AND URETERAL STENT INSERTION (Left: Ureter)  Patient Location: PACU  Anesthesia Type:General  Level of Consciousness: awake  Airway & Oxygen Therapy: Patient Spontanous Breathing and Patient connected to face mask oxygen  Post-op Assessment: Report given to RN and Post -op Vital signs reviewed and stable  Post vital signs: Reviewed and stable  Last Vitals:  Vitals Value Taken Time  BP 112/63 01/02/24 15:45  Temp    Pulse 56 01/02/24 15:46  Resp 18 01/02/24 15:46  SpO2 100 % 01/02/24 15:46  Vitals shown include unfiled device data.  Last Pain:  Vitals:   01/02/24 1213  TempSrc: Temporal  PainSc: 0-No pain         Complications: There were no known notable events for this encounter.

## 2024-01-02 NOTE — Anesthesia Procedure Notes (Signed)
 Procedure Name: Intubation Date/Time: 01/02/2024 2:39 PM  Performed by: Brien Sotero PARAS, CRNAPre-anesthesia Checklist: Patient identified, Patient being monitored, Timeout performed, Emergency Drugs available and Suction available Patient Re-evaluated:Patient Re-evaluated prior to induction Oxygen Delivery Method: Circle system utilized Preoxygenation: Pre-oxygenation with 100% oxygen Induction Type: IV induction Ventilation: Mask ventilation without difficulty Laryngoscope Size: McGrath and 4 Grade View: Grade I Tube type: Oral Tube size: 7.0 mm Number of attempts: 1 Airway Equipment and Method: Stylet Placement Confirmation: ETT inserted through vocal cords under direct vision, positive ETCO2 and breath sounds checked- equal and bilateral Secured at: 20 cm Tube secured with: Tape Dental Injury: Teeth and Oropharynx as per pre-operative assessment

## 2024-01-02 NOTE — Anesthesia Preprocedure Evaluation (Signed)
 Anesthesia Evaluation  Patient identified by MRN, date of birth, ID band Patient awake    Reviewed: Allergy & Precautions, H&P , NPO status , Patient's Chart, lab work & pertinent test results, reviewed documented beta blocker date and time   Airway Mallampati: II  TM Distance: >3 FB Neck ROM: full    Dental  (+) Teeth Intact   Pulmonary neg pulmonary ROS, former smoker   Pulmonary exam normal        Cardiovascular Exercise Tolerance: Good hypertension, On Medications negative cardio ROS Normal cardiovascular exam Rhythm:regular Rate:Normal     Neuro/Psych CVA  negative psych ROS   GI/Hepatic Neg liver ROS,GERD  Medicated,,  Endo/Other  negative endocrine ROSdiabetes, Well Controlled    Renal/GU negative Renal ROS  negative genitourinary   Musculoskeletal   Abdominal   Peds  Hematology negative hematology ROS (+)   Anesthesia Other Findings Past Medical History: 12/12/2023: Bladder mass 07/09/2010: Cerebrovascular disease 10/31/2012: Cervical disc herniation No date: Diabetes mellitus without complication (HCC) 2014: Erectile dysfunction No date: GERD (gastroesophageal reflux disease) 2012: Hyperlipidemia No date: Hypertension 2019: Otorrhagia of right ear No date: Polyp of ascending colon No date: Polyp of transverse colon No date: Problems with swallowing and mastication No date: Stricture and stenosis of esophagus 2009: Stroke [facility]) Past Surgical History: 2014: ANTERIOR CERVICAL DECOMP/DISCECTOMY FUSION     Comment:  C4-7 09/2013: COLONOSCOPY; N/A     Comment:  repeat due 09/2018 12/19/2023: COLONOSCOPY; N/A     Comment:  Procedure: COLONOSCOPY;  Surgeon: Jinny Carmine, MD;                Location: [facility] ENDOSCOPY;  Service: Endoscopy;                Laterality: N/A; 10/29/2018: COLONOSCOPY WITH PROPOFOL ; N/A     Comment:  Procedure: COLONOSCOPY WITH BIOPSY;  Surgeon: Jinny Carmine, MD;   Location: [facility];  Service:               Endoscopy;  Laterality: N/A;  DIABETIC doesnt want               arrival time earlier than 0730 10/29/2018: ESOPHAGEAL DILATION; N/A     Comment:  Procedure: ESOPHAGEAL DILATION;  Surgeon: Jinny Carmine,               MD;  Location: [facility];  Service: Endoscopy;               Laterality: N/A; 09/2013: ESOPHAGOGASTRODUODENOSCOPY; N/A 10/29/2018: ESOPHAGOGASTRODUODENOSCOPY (EGD) WITH PROPOFOL ; N/A     Comment:  Procedure: ESOPHAGOGASTRODUODENOSCOPY (EGD) WITH               DILATION;  Surgeon: Jinny Carmine, MD;  Location: [facility]               SURGERY CNTR;  Service: Endoscopy;  Laterality: N/A; 10/29/2018: POLYPECTOMY; N/A     Comment:  Procedure: POLYPECTOMY;  Surgeon: Jinny Carmine, MD;                Location: [facility];  Service: Endoscopy;                Laterality: N/A; 12/19/2023: POLYPECTOMY     Comment:  Procedure: POLYPECTOMY, INTESTINE;  Surgeon: Jinny Carmine, MD;  Location: [facility] ENDOSCOPY;  Service:  Endoscopy;;   Reproductive/Obstetrics negative OB ROS                              Anesthesia Physical Anesthesia Plan  ASA: 3  Anesthesia Plan: General ETT   Post-op Pain Management:    Induction:   PONV Risk Score and Plan: 3  Airway Management Planned:   Additional Equipment:   Intra-op Plan:   Post-operative Plan:   Informed Consent: I have reviewed the patients History and Physical, chart, labs and discussed the procedure including the risks, benefits and alternatives for the proposed anesthesia with the patient or authorized representative who has indicated his/her understanding and acceptance.     Dental Advisory Given  Plan Discussed with: CRNA  Anesthesia Plan Comments:         Anesthesia Quick Evaluation

## 2024-01-02 NOTE — Discharge Instructions (Addendum)
 Transurethral Resection of Bladder Tumor (TURBT) or Bladder Biopsy   Definition:  Transurethral Resection of the Bladder Tumor is a surgical procedure used to diagnose and remove tumors within the bladder. TURBT is the most common treatment for early stage bladder cancer.  General instructions:     Your recent bladder surgery requires very little post hospital care but some definite precautions.  Despite the fact that no skin incisions were used, the area around the bladder incisions are raw and covered with scabs to promote healing and prevent bleeding. Certain precautions are needed to insure that the scabs are not disturbed over the next 2-4 weeks while the healing proceeds.  Because the raw surface inside your bladder and the irritating effects of urine you may expect frequency of urination and/or urgency (a stronger desire to urinate) and perhaps even getting up at night more often. This will usually resolve or improve slowly over the healing period. You may see some blood in your urine over the first 6 weeks. Do not be alarmed, even if the urine was clear for a while. Get off your feet and drink lots of fluids until clearing occurs. If you start to pass clots or don't improve call us .  Diet:  You may return to your normal diet immediately. Because of the raw surface of your bladder, alcohol, spicy foods, foods high in acid and drinks with caffeine may cause irritation or frequency and should be used in moderation. To keep your urine flowing freely and avoid constipation, drink plenty of fluids during the day (8-10 glasses). Tip: Avoid cranberry juice because it is very acidic.  Activity:  Your physical activity doesn't need to be restricted. However, if you are very active, you may see some blood in the urine. We suggest that you reduce your activity under the circumstances until the bleeding has stopped.  Bowels:  It is important to keep your bowels regular during the postoperative  period. Straining with bowel movements can cause bleeding. A bowel movement every other day is reasonable. Use a mild laxative if needed, such as milk of magnesia 2-3 tablespoons, or 2 Dulcolax tablets. Call if you continue to have problems. If you had been taking narcotics for pain, before, during or after your surgery, you may be constipated. Take a laxative if necessary.   Medication:  You should resume your pre-surgery medications unless told not to. In addition you may be given an antibiotic to prevent or treat infection. Antibiotics are not always necessary. All medication should be taken as prescribed until the bottles are finished unless you are having an unusual reaction to one of the drugs.   You have a ureteral stent in your left ureter (completely internal) -this will remain in place until your next procedure Follow-up in the urology clinic in 1 week for pathology review  [facility] [address] [place], [place] [address] [phone]

## 2024-01-02 NOTE — Op Note (Signed)
 Date of procedure: 01/02/24  Preoperative diagnosis:  Bladder tumor   Postoperative diagnosis:  Bladder tumor   Procedure: TURBT (2-5 cm)  Left retrograde pyelogram  Left ureteral stent placement Meatal dilation of hypospadia, sequential urethral sounds  Surgeon: Penne Skye, MD  Anesthesia: General  Complications: None  Intraoperative findings:  Large papillary tumor with squamous/necrotic calcifications, positioned directly over the left trigone and left ureteral orifice.  Did not appear to be emanating from the distal ureter Complete resection of tumor, down to muscularis Resection involved left ureteral orifice-ultimately able to identify left UO with administration of fluorescein Left ureteral stent without string placed  EBL: < 5 cc  Specimens:  Bladder tumor, superficial Bladder tumor, deep margin  Indication: Caleb Davies. is a 73 y.o. patient with: 4.1 cm bladder mass via RBUS (Sept 2025) Ongoing GH   After reviewing the management options for treatment, they elected to proceed with the above surgical procedure(s). We have discussed the potential benefits and risks of the procedure, side effects of the proposed treatment, the likelihood of the patient achieving the goals of the procedure, and any potential problems that might occur during the procedure or recuperation. Informed consent has been obtained.  Description of procedure:  The patient was taken to the operating room and general anesthesia was induced. SCDs were placed for DVT prophylaxis. The patient was placed in the dorsal lithotomy position, prepped and draped in the usual sterile fashion, and preoperative antibiotics were administered. A preoperative time-out was performed.   Initial examination revealed a hypospadiac meatus with small caliber.  This required sequential dilation with urethral sounds from 12-11F. We transitioned to a 670F resectoscope with visual obturator and complete cystoscopy  was performed.  Immediately we visualized a large papillary bladder tumor originating on the left trigone directly over the left ureteral orifice.  The left ureteral orifice was not visualized initially.  Using a bipolar loop electrocautery we resected the entire tumor burden.  A majority of the initial pathology was sent as bladder tumor, superficial.  We then performed several additional resection swipes as a second specimen: Bladder tumor, deep margin.  Hemostasis was achieved with cautery.  Following resection we were still not able to visualize the left ureteral orifice.  As such, we administered 25 mg of fluorescein.  We then noted fluorescein effluxed from a pinpoint left UO within our resection bed.  We were able to cannulate the opening with a wire and open-ended catheter.  A distal retrograde pyelogram was performed which did not show any filling defects within the distal ureter, normal left ureteral contours no hydronephrosis, and no filling defects within the proximal upper tract.  The wire was replaced.  We elected to place a left ureteral stent without string. A 70F x 24 cm JJ ureteral stent without string was placed under fluoroscopic guidance.  Spot KUB confirmed appropriate proximal and distal curl.  At this point the bladder was left full and all instruments removed.  We placed a 16 [place] two-way Foley catheter.  Next we administered 2 g of gemcitabine and clamped the catheter.   At this point all instruments were removed and this concluded the procedure.  Patient tolerated well, was extubated and transferred to the PACU in excellent condition.  Disposition: Stable to PACU   Plan: Post-op Gemcitabine:  2g intravesical Gemcitabine was instilled intraoperatively at 1528; dwell for 1 hour in PACU then drain to chemo-designated container. Nursing: use chemo PPE, closed system, dispose urine/catheters/linens as hazardous waste; instruct  patient to void sitting, double flush, wash after  urination 24 hrs  - Follow up in clinic in 1 week for pathology review - Anticipate a 2nd look confirmation TURBT, possible left distal ureteroscopy in ~3-4 weeks  Penne Skye, MD

## 2024-01-02 NOTE — Progress Notes (Signed)
 Foley removed per care order

## 2024-01-03 ENCOUNTER — Encounter: Payer: Self-pay | Admitting: Urology

## 2024-01-03 NOTE — Progress Notes (Unsigned)
   01/10/2024 7:26 AM   Caleb Davies. 1950/11/03 [id number]  Reason for visit: Follow up bladder tumor   HPI: 73 y.o. male, follow up with me today S/p TURBT + 2g gemcitabine + Left ureteral stent placment (01/02/24)   - path = HGTa bladder Ca, muscle present and uninvolved  Prior HPI: ED visit (12/03/2023)-painless gross hematuria x2 RBUS-concerning for bladder mass, 4.1 cm left bladder sidewall lesion with internal Doppler flow   Denies further GH, asymptomatic from GU standpoint today  No prior urologic conditions No Fhx of GU malignancies   Longtime smoker, quit in 2009 -40 pack year prior to that Hx of prior CVA v(2009), DM2    Physical Exam: There were no vitals taken for this visit.   Constitutional:  Alert and oriented, No acute distress.  Laboratory Data: MRN: [id number]  Physician: [name]  DOB/Age Nov 02, 1950 (Age: 73) Gender: M  Collected Date: 01/02/2024  Received Date: 01/03/2024   FINAL DIAGNOSIS        1. Bladder, transurethral resection, tumor, superficial :       NONINVASIVE HIGH GRADE PAPILLARY UROTHELIAL CARCINOMA        2. Bladder, transurethral resection, tumor, deep margin :       BENIGN SUBMUCOSA AND MUSCULARIS PROPRIA (DETRUSOR MUSCLE)       NEGATIVE FOR INVASIVE CARCINOMA   Pertinent Imaging: N/A    Assessment & Plan:    Malignant neoplasm of lateral wall of urinary bladder (HCC) Assessment & Plan: High-risk NMIBC (dx Oct 2025)  ~4 cm Left trigonal papillary tumor   - TURBT + gem (01/02/24) - HGTa   - Left UO resected, currently stented   Reviewed his pathology results.  This reviewed his nonmuscle invasive bladder cancer.  Fortunately, deep margins were negative for muscle involvement.  However due to overall tumor burden, which stratify him has high risk.  I would like to perform a 2nd-look TURBT, confirm staging, assess left ureteral orifice, possible distal left ureteroscopy, with stent exchange vs removal. If R0  resection, with isolated HGTa disease, would recommend induction BCG in accordance with guidelines.   - schedule for OR: cystoscopy, TURBT, Left ureteroscopy, left ureteral stent exchange vs removal - CT urogram, next available to complete radiographic staging        Caleb JONELLE Georganne, MD  [facility] [address] [place], [place] [address] [phone]

## 2024-01-03 NOTE — H&P (View-Only) (Signed)
   01/10/2024 10:36 AM   [name]. [birth year] [id number]  Reason for visit: Follow up bladder tumor   HPI: 73 y.o. male, follow up with me today S/p TURBT + 2g gemcitabine  + Left ureteral stent placment (01/02/24)   - path = HGTa bladder Ca, muscle present and uninvolved  Overall, doing well following surgery.  He continues to have stent related discomfort, urinary urgency.  Postop Flomax , Ditropan  and Pyridium  did help although he had dry mouth and taste changes.  He does not want any further medication for symptoms  Prior HPI: ED visit (12/03/2023)-painless gross hematuria x2 RBUS-concerning for bladder mass, 4.1 cm left bladder sidewall lesion with internal Doppler flow   Denies further GH, asymptomatic from GU standpoint today  No prior urologic conditions No Fhx of GU malignancies   Longtime smoker, quit in 2009 -40 pack year prior to that Hx of prior CVA v(2009), DM2    Physical Exam: BP 131/61   Pulse 75   Ht 5' 10 (1.778 m)   Wt 198 lb 3.2 oz (89.9 kg)   BMI 28.44 kg/m    Constitutional:  Alert and oriented, No acute distress.  Laboratory Data: MRN: [id number]  Physician: Georganne Riis  DOB/Age 08-May-1950 (Age: 61) Gender: M  Collected Date: 01/02/2024  Received Date: 01/03/2024   FINAL DIAGNOSIS        1. Bladder, transurethral resection, tumor, superficial :       NONINVASIVE HIGH GRADE PAPILLARY UROTHELIAL CARCINOMA        2. Bladder, transurethral resection, tumor, deep margin :       BENIGN SUBMUCOSA AND MUSCULARIS PROPRIA (DETRUSOR MUSCLE)       NEGATIVE FOR INVASIVE CARCINOMA   Pertinent Imaging: N/A    Assessment & Plan:    Malignant neoplasm of lateral wall of urinary bladder (HCC) Assessment & Plan: High-risk NMIBC (dx Oct 2025)  ~4 cm Left trigonal papillary tumor   - TURBT + gem (01/02/24) - HGTa   - Left UO resected, currently stented   Reviewed his pathology results.  This reviewed his nonmuscle invasive bladder cancer.   Fortunately, deep margins were negative for muscle involvement.  However due to overall tumor burden, which stratify him has high risk.  I would like to perform a 2nd-look TURBT, confirm staging, assess left ureteral orifice, possible distal left ureteroscopy, with stent exchange vs removal. If R0 resection, with isolated HGTa disease, would recommend induction BCG in accordance with guidelines.   - schedule for OR: cystoscopy, TURBT, Left ureteroscopy, left ureteral stent exchange vs removal - pre-op UA/Ucx ~7 days prior - CT urogram + CXR, next available to complete radiographic staging  Orders: -     Ambulatory Referral For Surgery Scheduling -     CT HEMATURIA WORKUP; Future -     DG Chest 2 View; Future       Riis JONELLE Georganne, MD  [facility] [address] [place], [place] [address] [phone]

## 2024-01-04 LAB — SURGICAL PATHOLOGY

## 2024-01-09 DIAGNOSIS — C679 Malignant neoplasm of bladder, unspecified: Secondary | ICD-10-CM | POA: Insufficient documentation

## 2024-01-09 NOTE — Assessment & Plan Note (Addendum)
 High-risk NMIBC (dx Oct 2025)  ~4 cm Left trigonal papillary tumor   - TURBT + gem (01/02/24) - HGTa   - Left UO resected, currently stented   Reviewed his pathology results.  This reviewed his nonmuscle invasive bladder cancer.  Fortunately, deep margins were negative for muscle involvement.  However due to overall tumor burden, which stratify him has high risk.  I would like to perform a 2nd-look TURBT, confirm staging, assess left ureteral orifice, possible distal left ureteroscopy, with stent exchange vs removal. If R0 resection, with isolated HGTa disease, would recommend induction BCG in accordance with guidelines.   - schedule for OR: cystoscopy, TURBT, Left ureteroscopy, left ureteral stent exchange vs removal - pre-op UA/Ucx ~7 days prior - CT urogram + CXR, next available to complete radiographic staging

## 2024-01-10 ENCOUNTER — Ambulatory Visit (INDEPENDENT_AMBULATORY_CARE_PROVIDER_SITE_OTHER): Admitting: Urology

## 2024-01-10 ENCOUNTER — Telehealth: Payer: Self-pay

## 2024-01-10 ENCOUNTER — Other Ambulatory Visit: Payer: Self-pay

## 2024-01-10 VITALS — BP 131/61 | HR 75 | Ht 70.0 in | Wt 198.2 lb

## 2024-01-10 DIAGNOSIS — N329 Bladder disorder, unspecified: Secondary | ICD-10-CM

## 2024-01-10 DIAGNOSIS — C672 Malignant neoplasm of lateral wall of bladder: Secondary | ICD-10-CM

## 2024-01-10 NOTE — Progress Notes (Signed)
 Surgical Physician Order Form [facility]  Dr. Penne Skye, MD  * Scheduling expectation : ~3-6 weeks  *Length of Case: 45 min  *Clearance needed: no  *Anticoagulation Instructions: N/A  *Aspirin Instructions: Hold Aspirin  *Post-op visit Date/Instructions:  1-2 week with pathology review  *Diagnosis: Bladder Lesion  *Procedure: cystoscopy, possible TURBT, left ureteroscopy, left RPG, left ureteral stent exchange vs. removal   Additional orders: N/A  -Admit type: OUTpatient  -Anesthesia: Choice  -VTE Prophylaxis Standing Order SCD's       Other:   -Standing Lab Orders Per Anesthesia    Lab other: UA&Urine Culture  -Standing Test orders EKG/Chest x-ray per Anesthesia       Test other:   - Medications:  Ancef 2gm IV  -Other orders:  N/A

## 2024-01-10 NOTE — Patient Instructions (Addendum)
 Please Call [phone] to schedule CT scan and chest x-ray

## 2024-01-10 NOTE — Telephone Encounter (Signed)
 Per Dr. Georganne, Patient is to be scheduled for Diagnostic Cystoscopy, Left Ureteroscopy with Left Retrograde Pyelogram, Left Ureteral Stent Exchange versus Removal and Possible Transurethral Resection of Bladder Tumor   Mr. Weisgerber was contacted and possible surgical dates were discussed, Tuesday November 25th, 2025 was agreed upon for surgery. Patient was instructed that Dr. Georganne will require them to provide a pre-op UA & CX prior to surgery. This was ordered and scheduled drop off appointment was made for 01/24/2024.    Patient was directed to call [phone] between 1-3pm the day before surgery to find out surgical arrival time.  Instructions were given not to eat or drink from midnight on the night before surgery and have a driver for the day of surgery. On the surgery day patient was instructed to enter through the [facility] entrance of [facility] report the Same Day Surgery desk.   Pre-Admit Testing will be in contact via phone to set up an interview with the anesthesia team to review your history and medications prior to surgery.   Reminder of this information was sent via Mail to the patient.

## 2024-01-10 NOTE — Progress Notes (Signed)
   [facility] Posting Form  Surgery Date: Date: 02/06/2024  Surgeon: Dr. Penne Skye, MD  Inpt ( No  )   Outpt (Yes)   Obs ( No  )   Diagnosis: N32.9 Bladder Lesion   -CPT: 52000, 52234, 52351, 74420, 52310, [id number]  Surgery: Diagnostic Cystoscopy, Left Ureteroscopy with Left Retrograde Pyelogram, Left Ureteral Stent Exchange versus Removal and  Possible Transurethral Resection of Bladder Tumor  Stop Anticoagulations: Yes, hold 5 days before  Cardiac/Medical/Pulmonary Clearance needed: no  *Orders entered into EPIC  Date: 01/10/24   *Case booked in [place]  Date: 01/10/24  *Notified pt of Surgery: Date: 01/10/24  PRE-OP UA & CX: yes, will obtain in clinic on 01/24/2024  *Placed into Prior Authorization Work [place] Date: 01/10/24  Assistant/laser/rep:No

## 2024-01-16 ENCOUNTER — Ambulatory Visit

## 2024-01-17 ENCOUNTER — Ambulatory Visit
Admission: RE | Admit: 2024-01-17 | Discharge: 2024-01-17 | Disposition: A | Discharge status: Home / Self Care | Source: Ambulatory Visit | Attending: Urology | Admitting: Urology

## 2024-01-17 DIAGNOSIS — C672 Malignant neoplasm of lateral wall of bladder: Secondary | ICD-10-CM | POA: Diagnosis present

## 2024-01-17 MED ORDER — IOHEXOL 300 MG/ML  SOLN
100.0000 mL | Freq: Once | INTRAMUSCULAR | Status: AC | PRN
  Administered 2024-01-17: 100 mL via INTRAVENOUS

## 2024-01-18 ENCOUNTER — Other Ambulatory Visit
Admission: RE | Admit: 2024-01-18 | Discharge: 2024-01-18 | Disposition: A | Source: Home / Self Care | Attending: Urology | Admitting: Urology

## 2024-01-18 ENCOUNTER — Ambulatory Visit
Admission: RE | Admit: 2024-01-18 | Discharge: 2024-01-18 | Disposition: A | Discharge status: Home / Self Care | Attending: Urology | Admitting: Urology

## 2024-01-18 ENCOUNTER — Other Ambulatory Visit: Admit: 2024-01-18

## 2024-01-18 ENCOUNTER — Other Ambulatory Visit: Payer: Self-pay

## 2024-01-18 ENCOUNTER — Ambulatory Visit
Admission: RE | Admit: 2024-01-18 | Discharge: 2024-01-18 | Disposition: A | Discharge status: Home / Self Care | Source: Ambulatory Visit | Attending: Urology | Admitting: Urology

## 2024-01-18 DIAGNOSIS — C672 Malignant neoplasm of lateral wall of bladder: Secondary | ICD-10-CM

## 2024-01-18 DIAGNOSIS — D494 Neoplasm of unspecified behavior of bladder: Secondary | ICD-10-CM | POA: Diagnosis present

## 2024-01-18 DIAGNOSIS — Z01812 Encounter for preprocedural laboratory examination: Secondary | ICD-10-CM | POA: Insufficient documentation

## 2024-01-18 LAB — URINALYSIS, COMPLETE (UACMP) WITH MICROSCOPIC
Bacteria, UA: NONE SEEN
Bilirubin Urine: NEGATIVE
Glucose, UA: NEGATIVE mg/dL
Ketones, ur: NEGATIVE mg/dL
Nitrite: NEGATIVE
Protein, ur: 30 mg/dL — AB
Specific Gravity, Urine: 1.013 (ref 1.005–1.030)
pH: 6 (ref 5.0–8.0)

## 2024-01-21 LAB — URINE CULTURE: Culture: 40000 — AB

## 2024-01-24 ENCOUNTER — Other Ambulatory Visit

## 2024-01-30 ENCOUNTER — Encounter
Admission: RE | Admit: 2024-01-30 | Discharge: 2024-01-30 | Disposition: A | Discharge status: Home / Self Care | Source: Ambulatory Visit | Attending: Urology | Admitting: Urology

## 2024-01-30 ENCOUNTER — Other Ambulatory Visit: Payer: Self-pay

## 2024-01-30 VITALS — Ht 70.0 in | Wt 198.2 lb

## 2024-01-30 DIAGNOSIS — Z01818 Encounter for other preprocedural examination: Secondary | ICD-10-CM

## 2024-01-30 DIAGNOSIS — E11A Type 2 diabetes mellitus without complications in remission: Secondary | ICD-10-CM

## 2024-01-30 HISTORY — DX: Neoplasm of unspecified behavior of bladder: D49.4

## 2024-01-30 NOTE — Patient Instructions (Addendum)
 Your procedure is scheduled on: TUESDAY 02/06/24 Report to the Registration Desk on the 1st floor of the [facility]. To find out your arrival time, please call [phone] between 1PM - 3PM on:  MONDAY 02/05/24 If your arrival time is 6:00 am, do not arrive before that time as the [facility] entrance doors do not open until 6:00 am.  REMEMBER: Instructions that are not followed completely may result in serious medical risk, up to and including death; or upon the discretion of your surgeon and anesthesiologist your surgery may need to be rescheduled.  Do not eat food after midnight the night before surgery.  No gum chewing or hard candies.  You may however, drink WATER  up to 2 hours before you are scheduled to arrive for your surgery. Do not drink anything within 2 hours of your scheduled arrival time.  One week prior to surgery: Stop Anti-inflammatories (NSAIDS) such as Advil, Aleve, Ibuprofen, Motrin, Naproxen, Naprosyn and Aspirin based products such as Excedrin, Goody's Powder, BC Powder. Stop ANY OVER THE COUNTER supplements until after surgery.   You may however, continue to take Tylenol  if needed for pain up until the day of surgery.  Continue taking all of your other prescription medications up until the day of surgery.  ON THE DAY OF SURGERY ONLY TAKE THESE MEDICATIONS WITH SIPS OF WATER :  atorvastatin (LIPITOR)  metoprolol succinate (TOPROL-XL)* pantoprazole (PROTONIX)*  Use inhalers on the day of surgery and bring to the hospital.  No Alcohol for 24 hours before or after surgery.  No Smoking including e-cigarettes for 24 hours before surgery.  No chewable tobacco products for at least 6 hours before surgery.  No nicotine patches on the day of surgery.  Do not use any recreational drugs for at least a week (preferably 2 weeks) before your surgery.  Please be advised that the combination of cocaine and anesthesia may have negative outcomes, up to and including  death. If you test positive for cocaine, your surgery will be cancelled.  On the morning of surgery brush your teeth with toothpaste and water , you may rinse your mouth with mouthwash if you wish. Do not swallow any toothpaste or mouthwash.  Do not wear jewelry, make-up, hairpins, clips or nail polish.  For welded (permanent) jewelry: bracelets, anklets, waist bands, etc.  Please have this removed prior to surgery.  If it is not removed, there is a chance that hospital personnel will need to cut it off on the day of surgery.  Do not wear lotions, powders, or perfumes.   Do not shave body hair from the neck down 48 hours before surgery.  Contact lenses, hearing aids and dentures may not be worn into surgery.  Do not bring valuables to the hospital. [facility] is not responsible for any missing/lost belongings or valuables.   Notify your doctor if there is any change in your medical condition (cold, fever, infection).  Wear comfortable clothing (specific to your surgery type) to the hospital.  After surgery, you can help prevent lung complications by doing breathing exercises.  Take deep breaths and cough every 1-2 hours. Your doctor may order a device called an Incentive Spirometer to help you take deep breaths.  If you are being discharged the day of surgery, you will not be allowed to drive home. You will need a responsible individual to drive you home and stay with you for 24 hours after surgery.    If you are taking public transportation, you will need to  have a responsible individual with you.  Please call the Pre-admissions Testing Dept. at [phone] if you have any questions about these instructions.  Surgery Visitation Policy:  Patients having surgery or a procedure may have two visitors.  Children under the age of 19 must have an adult with them who is not the patient.  Merchandiser, Retail to address health-related social needs:   [email or web address]

## 2024-02-01 ENCOUNTER — Encounter
Admission: RE | Admit: 2024-02-01 | Discharge: 2024-02-01 | Disposition: A | Discharge status: Home / Self Care | Source: Ambulatory Visit | Attending: Urology | Admitting: Urology

## 2024-02-01 ENCOUNTER — Encounter: Payer: Self-pay | Admitting: Urgent Care

## 2024-02-01 DIAGNOSIS — E11A Type 2 diabetes mellitus without complications in remission: Secondary | ICD-10-CM | POA: Insufficient documentation

## 2024-02-01 DIAGNOSIS — Z01812 Encounter for preprocedural laboratory examination: Secondary | ICD-10-CM | POA: Insufficient documentation

## 2024-02-01 DIAGNOSIS — Z01818 Encounter for other preprocedural examination: Secondary | ICD-10-CM | POA: Diagnosis present

## 2024-02-01 LAB — BASIC METABOLIC PANEL WITH GFR
Anion gap: 11 (ref 5–15)
BUN: 13 mg/dL (ref 8–23)
CO2: 25 mmol/L (ref 22–32)
Calcium: 9.4 mg/dL (ref 8.9–10.3)
Chloride: 107 mmol/L (ref 98–111)
Creatinine, Ser: 1.39 mg/dL — ABNORMAL HIGH (ref 0.61–1.24)
GFR, Estimated: 54 mL/min — ABNORMAL LOW (ref >60)
Glucose, Bld: 154 mg/dL — ABNORMAL HIGH (ref 70–99)
Potassium: 4 mmol/L (ref 3.5–5.1)
Sodium: 143 mmol/L (ref 135–145)

## 2024-02-02 ENCOUNTER — Other Ambulatory Visit: Payer: Self-pay | Admitting: Urology

## 2024-02-02 MED ORDER — NITROFURANTOIN MONOHYD MACRO 100 MG PO CAPS: 100.0000 mg | ORAL_CAPSULE | Freq: Two times a day (BID) | ORAL | 0 refills | Status: AC

## 2024-02-02 NOTE — Progress Notes (Signed)
 Likely asymptomatic bacteruria vs colonization on recent Ucx. Will send in for 5-day Macrobid  to sterilize prior to upcoming surgery next week.

## 2024-02-06 ENCOUNTER — Encounter: Payer: Self-pay | Admitting: Urology

## 2024-02-06 ENCOUNTER — Encounter
Admission: RE | Disposition: A | Discharge status: Home / Self Care | Payer: Self-pay | Source: Home / Self Care | Attending: Urology

## 2024-02-06 ENCOUNTER — Ambulatory Visit

## 2024-02-06 ENCOUNTER — Other Ambulatory Visit: Payer: Self-pay

## 2024-02-06 ENCOUNTER — Ambulatory Visit
Admission: RE | Admit: 2024-02-06 | Discharge: 2024-02-06 | Disposition: A | Discharge status: Home / Self Care | Attending: Urology | Admitting: Urology

## 2024-02-06 DIAGNOSIS — Z87891 Personal history of nicotine dependence: Secondary | ICD-10-CM | POA: Insufficient documentation

## 2024-02-06 DIAGNOSIS — Z01818 Encounter for other preprocedural examination: Secondary | ICD-10-CM

## 2024-02-06 DIAGNOSIS — Z8673 Personal history of transient ischemic attack (TIA), and cerebral infarction without residual deficits: Secondary | ICD-10-CM | POA: Insufficient documentation

## 2024-02-06 DIAGNOSIS — D303 Benign neoplasm of bladder: Secondary | ICD-10-CM

## 2024-02-06 DIAGNOSIS — Z9889 Other specified postprocedural states: Secondary | ICD-10-CM | POA: Insufficient documentation

## 2024-02-06 DIAGNOSIS — E119 Type 2 diabetes mellitus without complications: Secondary | ICD-10-CM | POA: Insufficient documentation

## 2024-02-06 DIAGNOSIS — I10 Essential (primary) hypertension: Secondary | ICD-10-CM | POA: Insufficient documentation

## 2024-02-06 DIAGNOSIS — N329 Bladder disorder, unspecified: Secondary | ICD-10-CM

## 2024-02-06 DIAGNOSIS — N3090 Cystitis, unspecified without hematuria: Secondary | ICD-10-CM | POA: Insufficient documentation

## 2024-02-06 DIAGNOSIS — E11A Type 2 diabetes mellitus without complications in remission: Secondary | ICD-10-CM

## 2024-02-06 DIAGNOSIS — Z79899 Other long term (current) drug therapy: Secondary | ICD-10-CM | POA: Insufficient documentation

## 2024-02-06 DIAGNOSIS — N3289 Other specified disorders of bladder: Secondary | ICD-10-CM | POA: Insufficient documentation

## 2024-02-06 DIAGNOSIS — C672 Malignant neoplasm of lateral wall of bladder: Secondary | ICD-10-CM | POA: Insufficient documentation

## 2024-02-06 HISTORY — PX: CYSTOSCOPY WITH RETROGRADE PYELOGRAM, URETEROSCOPY AND STENT PLACEMENT: SHX5789

## 2024-02-06 HISTORY — PX: CYSTOSCOPY W/ URETERAL STENT REMOVAL: SHX1430

## 2024-02-06 HISTORY — PX: TRANSURETHRAL RESECTION OF BLADDER TUMOR: SHX2575

## 2024-02-06 LAB — GLUCOSE, CAPILLARY: Glucose-Capillary: 131 mg/dL — ABNORMAL HIGH (ref 70–99)

## 2024-02-06 SURGERY — TURBT (TRANSURETHRAL RESECTION OF BLADDER TUMOR)
Anesthesia: General | Site: Ureter

## 2024-02-06 MED ORDER — IOHEXOL 180 MG/ML  SOLN
INTRAMUSCULAR | Status: DC | PRN
Start: 2024-02-06 — End: 2024-02-06
  Administered 2024-02-06: 10 mL

## 2024-02-06 MED ORDER — STERILE WATER FOR IRRIGATION IR SOLN
Status: DC | PRN
  Administered 2024-02-06: 3000 mL
  Administered 2024-02-06: 500 mL

## 2024-02-06 MED ORDER — MIDAZOLAM HCL 2 MG/2ML IJ SOLN
INTRAMUSCULAR | Status: AC
  Filled 2024-02-06: qty 2

## 2024-02-06 MED ORDER — CHLORHEXIDINE GLUCONATE 0.12 % MT SOLN
15.0000 mL | Freq: Once | OROMUCOSAL | Status: AC
  Administered 2024-02-06: 15 mL via OROMUCOSAL

## 2024-02-06 MED ORDER — OXYCODONE HCL 5 MG PO TABS
5.0000 mg | ORAL_TABLET | Freq: Once | ORAL | Status: AC
  Administered 2024-02-06: 5 mg via ORAL

## 2024-02-06 MED ORDER — CEFAZOLIN SODIUM-DEXTROSE 2-4 GM/100ML-% IV SOLN
2.0000 g | INTRAVENOUS | Status: AC
  Administered 2024-02-06: 2 g via INTRAVENOUS

## 2024-02-06 MED ORDER — TAMSULOSIN HCL 0.4 MG PO CAPS: 0.4000 mg | ORAL_CAPSULE | Freq: Every day | ORAL | 3 refills | Status: AC

## 2024-02-06 MED ORDER — PROPOFOL 1000 MG/100ML IV EMUL
INTRAVENOUS | Status: AC
  Filled 2024-02-06: qty 100

## 2024-02-06 MED ORDER — ACETAMINOPHEN 10 MG/ML IV SOLN: 1000.0000 mg | Freq: Once | INTRAVENOUS | Status: DC | PRN

## 2024-02-06 MED ORDER — ORAL CARE MOUTH RINSE: 15.0000 mL | Freq: Once | OROMUCOSAL | Status: AC

## 2024-02-06 MED ORDER — CEFAZOLIN SODIUM-DEXTROSE 2-4 GM/100ML-% IV SOLN
INTRAVENOUS | Status: AC
  Filled 2024-02-06: qty 100

## 2024-02-06 MED ORDER — DROPERIDOL 2.5 MG/ML IJ SOLN
INTRAMUSCULAR | Status: AC
Start: 2024-02-06 — End: 2024-02-06
  Filled 2024-02-06: qty 2

## 2024-02-06 MED ORDER — PROPOFOL 500 MG/50ML IV EMUL
INTRAVENOUS | Status: DC | PRN
  Administered 2024-02-06: 50 mg via INTRAVENOUS
  Administered 2024-02-06: 200 ug/kg/min via INTRAVENOUS

## 2024-02-06 MED ORDER — OXYCODONE HCL 5 MG/5ML PO SOLN: 5.0000 mg | Freq: Once | ORAL | Status: DC | PRN

## 2024-02-06 MED ORDER — SODIUM CHLORIDE 0.9 % IV SOLN: INTRAVENOUS | Status: DC

## 2024-02-06 MED ORDER — FENTANYL CITRATE (PF) 100 MCG/2ML IJ SOLN: 25.0000 ug | INTRAMUSCULAR | Status: DC | PRN

## 2024-02-06 MED ORDER — MIDAZOLAM HCL (PF) 2 MG/2ML IJ SOLN
INTRAMUSCULAR | Status: DC | PRN
  Administered 2024-02-06: 2 mg via INTRAVENOUS

## 2024-02-06 MED ORDER — LIDOCAINE HCL (PF) 2 % IJ SOLN
INTRAMUSCULAR | Status: AC
  Filled 2024-02-06: qty 5

## 2024-02-06 MED ORDER — SODIUM CHLORIDE 0.9 % IR SOLN
Status: DC | PRN
  Administered 2024-02-06: 3000 mL

## 2024-02-06 MED ORDER — OXYCODONE HCL 5 MG PO TABS
ORAL_TABLET | ORAL | Status: AC
  Filled 2024-02-06: qty 1

## 2024-02-06 MED ORDER — OXYCODONE HCL 5 MG PO TABS: 5.0000 mg | ORAL_TABLET | Freq: Once | ORAL | Status: DC | PRN

## 2024-02-06 MED ORDER — CHLORHEXIDINE GLUCONATE 0.12 % MT SOLN
OROMUCOSAL | Status: AC
  Filled 2024-02-06: qty 15

## 2024-02-06 MED ORDER — DROPERIDOL 2.5 MG/ML IJ SOLN
0.6250 mg | Freq: Once | INTRAMUSCULAR | Status: AC
  Administered 2024-02-06: 0.625 mg via INTRAVENOUS
  Filled 2024-02-06: qty 2

## 2024-02-06 MED ORDER — LIDOCAINE HCL (CARDIAC) PF 100 MG/5ML IV SOSY
PREFILLED_SYRINGE | INTRAVENOUS | Status: DC | PRN
  Administered 2024-02-06: 100 mg via INTRAVENOUS

## 2024-02-06 MED ORDER — FENTANYL CITRATE (PF) 100 MCG/2ML IJ SOLN
INTRAMUSCULAR | Status: AC
  Filled 2024-02-06: qty 2

## 2024-02-06 MED ORDER — DROPERIDOL 2.5 MG/ML IJ SOLN: 0.6250 mg | Freq: Once | INTRAMUSCULAR | Status: DC | PRN

## 2024-02-06 MED ORDER — OXYCODONE HCL 5 MG PO TABS
5.0000 mg | ORAL_TABLET | Freq: Once | ORAL | Status: DC
Start: 2024-02-06 — End: 2024-02-06

## 2024-02-06 SURGICAL SUPPLY — 24 items
BAG DRAIN SIEMENS DORNER NS (MISCELLANEOUS) ×4 IMPLANT
BAG URINE DRAIN 2000ML AR STRL (UROLOGICAL SUPPLIES) ×4 IMPLANT
BRUSH SCRUB EZ 4% CHG (MISCELLANEOUS) ×4 IMPLANT
CATH FOLEY 2WAY SIL 18X30 (CATHETERS) IMPLANT
CATH URETL OPEN 5X70 (CATHETERS) ×4 IMPLANT
DRAPE UTILITY 15X26 TOWEL STRL (DRAPES) ×4 IMPLANT
DRSG TELFA 3X4 N-ADH STERILE (GAUZE/BANDAGES/DRESSINGS) ×4 IMPLANT
ELECTRODE LOOP 22F BIPOLAR SML (ELECTROSURGICAL) IMPLANT
ELECTRODE REM PT RTRN 9FT ADLT (ELECTROSURGICAL) IMPLANT
GLOVE BIOGEL PI IND STRL 7.0 (GLOVE) ×4 IMPLANT
GOWN STRL REUS W/ TWL LRG LVL3 (GOWN DISPOSABLE) ×8 IMPLANT
GUIDEWIRE STR DUAL SENSOR (WIRE) ×4 IMPLANT
KIT TURNOVER CYSTO (KITS) ×4 IMPLANT
LOOP CUT BIPOLAR 24F LRG (ELECTROSURGICAL) IMPLANT
NDL SAFETY ECLIP 18X1.5 (MISCELLANEOUS) ×4 IMPLANT
PACK CYSTO AR (MISCELLANEOUS) ×4 IMPLANT
PAD ARMBOARD POSITIONER FOAM (MISCELLANEOUS) ×4 IMPLANT
SET CYSTO IRRIGATION (SET/KITS/TRAYS/PACK) ×4 IMPLANT
SET IRRIG Y-TYPE CYSTO (SET/KITS/TRAYS/PACK) ×4 IMPLANT
SOL .9 NS 3000ML IRR UROMATIC (IV SOLUTION) ×8 IMPLANT
SOLN STERILE WATER BTL 1000 ML (IV SOLUTION) ×4 IMPLANT
SURGILUBE 2OZ TUBE FLIPTOP (MISCELLANEOUS) ×4 IMPLANT
SYRINGE TOOMEY IRRIG 70ML (MISCELLANEOUS) ×4 IMPLANT
WATER STERILE IRR 3000ML UROMA (IV SOLUTION) IMPLANT

## 2024-02-06 NOTE — Discharge Instructions (Addendum)
 Transurethral Resection of Bladder Tumor (TURBT) or Bladder Biopsy   Definition:  Transurethral Resection of the Bladder Tumor is a surgical procedure used to diagnose and remove tumors within the bladder. TURBT is the most common treatment for early stage bladder cancer.  General instructions:     Your recent bladder surgery requires very little post hospital care but some definite precautions.  Despite the fact that no skin incisions were used, the area around the bladder incisions are raw and covered with scabs to promote healing and prevent bleeding. Certain precautions are needed to insure that the scabs are not disturbed over the next 2-4 weeks while the healing proceeds.  Because the raw surface inside your bladder and the irritating effects of urine you may expect frequency of urination and/or urgency (a stronger desire to urinate) and perhaps even getting up at night more often. This will usually resolve or improve slowly over the healing period. You may see some blood in your urine over the first 6 weeks. Do not be alarmed, even if the urine was clear for a while. Get off your feet and drink lots of fluids until clearing occurs. If you start to pass clots or don't improve call us .  Diet:  You may return to your normal diet immediately. Because of the raw surface of your bladder, alcohol, spicy foods, foods high in acid and drinks with caffeine may cause irritation or frequency and should be used in moderation. To keep your urine flowing freely and avoid constipation, drink plenty of fluids during the day (8-10 glasses). Tip: Avoid cranberry juice because it is very acidic.  Activity:  Your physical activity doesn't need to be restricted. However, if you are very active, you may see some blood in the urine. We suggest that you reduce your activity under the circumstances until the bleeding has stopped.  Bowels:  It is important to keep your bowels regular during the postoperative  period. Straining with bowel movements can cause bleeding. A bowel movement every other day is reasonable. Use a mild laxative if needed, such as milk of magnesia 2-3 tablespoons, or 2 Dulcolax tablets. Call if you continue to have problems. If you had been taking narcotics for pain, before, during or after your surgery, you may be constipated. Take a laxative if necessary.   Medication:  You should resume your pre-surgery medications unless told not to. In addition you may be given an antibiotic to prevent or treat infection. Antibiotics are not always necessary. All medication should be taken as prescribed until the bottles are finished unless you are having an unusual reaction to one of the drugs.  - Follow up in Urology clinic in 1-2 weeks for pathology review - New Rx for Flomax  sent to pharmacy - to help with bladder emptying [facility]   [facility] [address] [place], [place] [address] [phone]

## 2024-02-06 NOTE — Transfer of Care (Signed)
 Immediate Anesthesia Transfer of Care Note  Patient: Caleb Davies.  Procedure(s) Performed: TURBT (TRANSURETHRAL RESECTION OF BLADDER TUMOR) (Bladder) REMOVAL, STENT, URETER, CYSTOSCOPIC (Left: Ureter) CYSTOURETEROSCOPY, WITH RETROGRADE PYELOGRAM (Left: Ureter)  Patient Location: PACU  Anesthesia Type:MAC  Level of Consciousness: drowsy  Airway & Oxygen Therapy: Patient Spontanous Breathing  Post-op Assessment: Report given to RN and Post -op Vital signs reviewed and stable  Post vital signs: Reviewed and stable  Last Vitals:  Vitals Value Taken Time  BP    Temp    Pulse 74 02/06/24 10:26  Resp 15 02/06/24 10:26  SpO2 93 % 02/06/24 10:26  Vitals shown include unfiled device data.  Last Pain:  Vitals:   02/06/24 0856  PainSc: 0-No pain         Complications: There were no known notable events for this encounter.

## 2024-02-06 NOTE — Interval H&P Note (Signed)
 History and Physical Interval Note:  02/06/2024 9:37 AM  [name].  has presented today for surgery, with the diagnosis of Bladder Lesion.  The various methods of treatment have been discussed with the patient and family. After consideration of risks, benefits and other options for treatment, the patient has consented to  Procedure(s) with comments: CYSTOSCOPY (N/A) - Diagnostic TURBT (TRANSURETHRAL RESECTION OF BLADDER TUMOR) (N/A) REMOVAL, STENT, URETER, CYSTOSCOPIC (Left) CYSTOURETEROSCOPY, WITH RETROGRADE PYELOGRAM AND STENT INSERTION as a surgical intervention.  The patient's history has been reviewed, patient examined, no change in status, stable for surgery.  I have reviewed the patient's chart and labs.  Questions were answered to the patient's satisfaction.    General: no acute distress, alert/oriented, conversational  HEENT: equal nondilated pupils CV: regular rate Lung: unlabored breathing, regular rate and rhythm  Abd: nondistended, nontender with palpation, no palpable masses  MSK: moving all extremities without issue, normal observed motor function   Caleb Davies

## 2024-02-06 NOTE — Anesthesia Preprocedure Evaluation (Addendum)
 Anesthesia Evaluation  Patient identified by MRN, date of birth, ID band Patient awake    Reviewed: Allergy & Precautions, H&P , NPO status , Patient's Chart, lab work & pertinent test results  Airway Mallampati: III  TM Distance: >3 FB Neck ROM: full    Dental no notable dental hx.    Pulmonary former smoker   Pulmonary exam normal        Cardiovascular Exercise Tolerance: Good hypertension, Pt. on home beta blockers and Pt. on medications Normal cardiovascular exam     Neuro/Psych CVA  negative psych ROS   GI/Hepatic Neg liver ROS,GERD  ,,  Endo/Other  diabetes, Well Controlled, Type 2    Renal/GU Renal InsufficiencyRenal disease     Musculoskeletal   Abdominal Normal abdominal exam  (+)   Peds  Hematology negative hematology ROS (+)   Anesthesia Other Findings Past Medical History: 12/12/2023: Bladder mass No date: Bladder tumor 07/09/2010: Cerebrovascular disease 10/31/2012: Cervical disc herniation No date: Diabetes mellitus without complication (HCC) 2014: Erectile dysfunction No date: GERD (gastroesophageal reflux disease) 2012: Hyperlipidemia No date: Hypertension 2019: Otorrhagia of right ear No date: Polyp of ascending colon No date: Polyp of transverse colon No date: Problems with swallowing and mastication No date: Stricture and stenosis of esophagus 2009: Stroke [facility])  Past Surgical History: 2014: ANTERIOR CERVICAL DECOMP/DISCECTOMY FUSION     Comment:  C4-7 01/02/2024: BLADDER INSTILLATION; N/A     Comment:  Procedure: INSTILLATION, BLADDER;  Surgeon: Georganne Penne SAUNDERS, MD;  Location: [facility] ORS;  Service: Urology;                Laterality: N/A;  Gemcitabine  09/2013: COLONOSCOPY; N/A     Comment:  repeat due 09/2018 12/19/2023: COLONOSCOPY; N/A     Comment:  Procedure: COLONOSCOPY;  Surgeon: Jinny Carmine, MD;                Location: [facility] ENDOSCOPY;  Service: Endoscopy;                 Laterality: N/A; 10/29/2018: COLONOSCOPY WITH PROPOFOL ; N/A     Comment:  Procedure: COLONOSCOPY WITH BIOPSY;  Surgeon: Jinny Carmine, MD;  Location: [facility];  Service:               Endoscopy;  Laterality: N/A;  DIABETIC doesnt want               arrival time earlier than 0730 01/02/2024: CYSTOSCOPY W/ URETERAL STENT PLACEMENT; Left     Comment:  Procedure: CYSTOSCOPY, WITH RETROGRADE PYELOGRAM AND               URETERAL STENT INSERTION;  Surgeon: Georganne Penne SAUNDERS,               MD;  Location: [facility] ORS;  Service: Urology;  Laterality:               Left; 10/29/2018: ESOPHAGEAL DILATION; N/A     Comment:  Procedure: ESOPHAGEAL DILATION;  Surgeon: Jinny Carmine,               MD;  Location: [facility];  Service: Endoscopy;               Laterality: N/A; 09/2013: ESOPHAGOGASTRODUODENOSCOPY; N/A 10/29/2018: ESOPHAGOGASTRODUODENOSCOPY (EGD) WITH PROPOFOL ; N/A     Comment:  Procedure: ESOPHAGOGASTRODUODENOSCOPY (EGD) WITH  DILATION;  Surgeon: Jinny Carmine, MD;  Location: [facility]               SURGERY CNTR;  Service: Endoscopy;  Laterality: N/A; 10/29/2018: POLYPECTOMY; N/A     Comment:  Procedure: POLYPECTOMY;  Surgeon: Jinny Carmine, MD;                Location: [facility];  Service: Endoscopy;                Laterality: N/A; 12/19/2023: POLYPECTOMY     Comment:  Procedure: POLYPECTOMY, INTESTINE;  Surgeon: Jinny Carmine, MD;  Location: [facility] ENDOSCOPY;  Service:               Endoscopy;; 01/02/2024: TRANSURETHRAL RESECTION OF BLADDER TUMOR; N/A     Comment:  Procedure: TURBT (TRANSURETHRAL RESECTION OF BLADDER               TUMOR);  Surgeon: Georganne Penne SAUNDERS, MD;  Location: [facility]               ORS;  Service: Urology;  Laterality: N/A;     Reproductive/Obstetrics negative OB ROS                              Anesthesia Physical Anesthesia Plan  ASA: 3  Anesthesia Plan: General    Post-op Pain Management: Ofirmev  IV (intra-op)*   Induction: Intravenous  PONV Risk Score and Plan: 2 and Ondansetron , Dexamethasone , Midazolam , Propofol  infusion and TIVA  Airway Management Planned: Natural Airway  Additional Equipment:   Intra-op Plan:   Post-operative Plan:   Informed Consent: I have reviewed the patients History and Physical, chart, labs and discussed the procedure including the risks, benefits and alternatives for the proposed anesthesia with the patient or authorized representative who has indicated his/her understanding and acceptance.     Dental Advisory Given  Plan Discussed with: CRNA and Surgeon  Anesthesia Plan Comments:          Anesthesia Quick Evaluation

## 2024-02-06 NOTE — Op Note (Signed)
 Date of procedure: 02/06/24  Preoperative diagnosis:  Bladder cancer   Postoperative diagnosis:  Bladder cancer   Procedure: Cystoscopy TURBT (<2cm)  Left retrograde pyelogram  Surgeon: Penne Skye, MD  Anesthesia: General  Complications: None  Intraoperative findings:  Some residual frumpy tissue around left trigone - additional deep TUR performed and sent for pathology. Grossly negative disease at completion Left RPG - normal distal to mid ureter, no filling defects.  Left UO appeared widely patent, uninvolved with today's resection. Left ureteral stent removed   EBL: <5 cc  Specimens:  Bladder tumor, deep margins  Indication: Caleb Morozov. is a 73 y.o. 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 We have discussed the potential benefits and risks of the procedure, side effects of the proposed treatment, the likelihood of the patient achieving the goals of the procedure, and any potential problems that might occur during the procedure or recuperation. Informed consent has been obtained.  Description of procedure:  The patient was taken to the operating room and general anesthesia was induced. SCDs were placed for DVT prophylaxis. The patient was placed in the dorsal lithotomy position, prepped and draped in the usual sterile fashion, and preoperative antibiotics were administered. A preoperative time-out was performed.   We began with repeat dilation of his hypospadia, from 20-32F with male sounds.  We began with rigid cystoscopy and a complete inspection of the urethra and bladder was performed.  Bilateral ureteral orifices were noted in orthotopic location. The  left UO was visualized with a stent emanating. There was some frumpy mucosal heaping around left trigone and bladder base, although largely negative for gross tumor. We intubated the left UO with a 62F open ended catheter along side the stent. A distal RPG was obtained with the above findings. A wire was left in the left ureter, and the prior stent was fully removed. Next, We transitioned to a 52F resectoscope with Bipolar loop electrocautery. We performed several resections along the prior TUR bed, including adjacent tissue- which may have been reactive edema. Resections were carried down to muscle. The left UO was uninvolved with the resection and appeared widely patent. The distal UVJ was visible and no evidence of tumor involvement of the ureter.  Hemostasis was achieved with careful electrocautery along the periphery of our resection beds.   At this point, the left ureteral wire was removed.  At this point all instruments were removed and this concluded the procedure.  Patient tolerated well, was extubated and transferred to the PACU in excellent condition.  Disposition: Stable to PACU   Plan: - Follow up in ~1-2 weeks for pathology review.  - Anticipate induction BCG vs induction Gemcitabine  for HR NMIBC   Penne Skye, MD

## 2024-02-07 ENCOUNTER — Encounter: Payer: Self-pay | Admitting: Urology

## 2024-02-07 LAB — SURGICAL PATHOLOGY

## 2024-02-07 NOTE — Anesthesia Postprocedure Evaluation (Signed)
 Anesthesia Post Note  Patient: Caleb Davies.  Procedure(s) Performed: TURBT (TRANSURETHRAL RESECTION OF BLADDER TUMOR) (Bladder) REMOVAL, STENT, URETER, CYSTOSCOPIC (Left: Ureter) CYSTOURETEROSCOPY, WITH RETROGRADE PYELOGRAM (Left: Ureter)  Patient location during evaluation: PACU Anesthesia Type: General Level of consciousness: awake and alert Pain management: pain level controlled Vital Signs Assessment: post-procedure vital signs reviewed and stable Respiratory status: spontaneous breathing, nonlabored ventilation and respiratory function stable Cardiovascular status: blood pressure returned to baseline and stable Postop Assessment: no apparent nausea or vomiting Anesthetic complications: no   There were no known notable events for this encounter.   Last Vitals:  Vitals:   02/06/24 1045 02/06/24 1126  BP: 123/71 115/77  Pulse: (!) 57   Resp: 14 16  Temp: 36.4 C (!) 36.2 C  SpO2: 97% 100%    Last Pain:  Vitals:   02/06/24 1126  TempSrc: Temporal  PainSc: 4                  Camellia Merilee Louder

## 2024-02-12 NOTE — Assessment & Plan Note (Signed)
 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  However, his final clinical staging is high risk nonmuscle invasive bladder cancer-which warrants further treatment.  I explained intravesical treatment options including BCG (and induction gemcitabine  and lieu of BCG shortage).  These are 6-week treatment regimens with weekly catheter installations.  He needs very close monitoring of his disease.  I do worry about his initial presentation with left hydroureteronephrosis, although this does seem obstructive due to tumor burden covering UO rather than muscle involvement or intramural disease.   - Refer to medical oncology for 6-week induction gemcitabine  - F/u with me in ~3 months for post-induction surveillance cystoscopy in clinic

## 2024-02-12 NOTE — Progress Notes (Unsigned)
   02/15/2024 7:55 AM   Caleb [name] Davies. [birth year] [id number]  Reason for visit: Follow up HR NMIBC   HPI: 73 y.o. male, follow up with me today  Recent confirmatory TURBT 02/06/24 was NED    Prior HPI: ED visit (12/03/2023)-painless gross hematuria x2 RBUS-concerning for bladder mass, 4.1 cm left bladder sidewall lesion with internal Doppler flow  S/p TURBT + 2g gemcitabine  + Left ureteral stent placment (01/02/24)   - path = HGTa bladder Ca, muscle present and uninvolved  S/p 2nd TURBT (02/06/24)   - path = negative    Denies further GH, asymptomatic from GU standpoint today  No prior urologic conditions No Fhx of GU malignancies   Longtime smoker, quit in 2009 -40 pack year prior to that Hx of prior CVA v(2009), DM2     Physical Exam: There were no vitals taken for this visit.   Constitutional:  Alert and oriented, No acute distress.  Laboratory Data: REPORT OF SURGICAL PATHOLOGY    Accession #: [id number]  Patient Name: Caleb Davies  Visit # : [id number]   MRN: [id number]  Physician: Georganne Riis  DOB/Age [birth year] (Age: 33) Gender: M  Collected Date: 02/06/2024  Received Date: 02/06/2024   FINAL DIAGNOSIS        1. Bladder, biopsy, Tumor deep margin :       UROTHELIAL HYPERPLASIA WITH DYSPLASIA       SUBMUCOSA WITH INFLAMMATION AND ISCHEMIC NECROSIS       NEGATIVE FOR CARCINOMA   Pertinent Imaging: I have personally viewed and interpreted the CTU (01/20/24)-  IMPRESSION: 1. Mild left bladder wall thickening; treatment related or could represent residual disease. 2. No evidence of abdominopelvic metastatic disease. 3. 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 This can be evaluated on routine follow-up imaging. 4. Right common iliac artery aneurysm. 5. Incidental findings, including: Aortic atherosclerosis ([id number]). Coronary  artery atherosclerosis. Cholelithiasis. Fat-containing left inguinal and ventral abdominal wall hernias.  CXR 01/22/24 - IMPRESSION: 1. No definite plain film evidence of pulmonary metastatic disease.     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  However, his final clinical staging is high risk nonmuscle invasive bladder cancer-which warrants further treatment.  I explained intravesical treatment options including BCG (and induction gemcitabine  and lieu of BCG shortage).  These are 6-week treatment regimens with weekly catheter installations.  He needs very close monitoring of his disease.  I do worry about his initial presentation with left hydroureteronephrosis, although this does seem obstructive due to tumor burden covering UO rather than muscle involvement or intramural disease.   - Refer to medical oncology for 6-week induction gemcitabine  - F/u with me in ~3 months for post-induction surveillance cystoscopy in clinic        Riis JONELLE Georganne, MD  [facility] [address] [place], [place] [address] [phone]

## 2024-02-15 ENCOUNTER — Encounter: Payer: Self-pay | Admitting: Urology

## 2024-02-15 ENCOUNTER — Ambulatory Visit: Admitting: Urology

## 2024-02-15 VITALS — BP 147/61 | HR 83 | Ht 70.0 in | Wt 199.8 lb

## 2024-02-15 DIAGNOSIS — C672 Malignant neoplasm of lateral wall of bladder: Secondary | ICD-10-CM | POA: Diagnosis not present

## 2024-02-15 NOTE — Patient Instructions (Signed)

## 2024-02-19 NOTE — Assessment & Plan Note (Signed)
 REports that the abodminal pain has resolved offo of metformin. Stay off the metofirn.   A1C ok in Nov.  Metformin on allergy list. REpeat A1C in Feb.  If high, would add on something (non metformin).   Watch the carbohydrates

## 2024-02-23 ENCOUNTER — Inpatient Hospital Stay: Attending: Oncology | Admitting: Oncology

## 2024-02-23 ENCOUNTER — Inpatient Hospital Stay

## 2024-02-23 ENCOUNTER — Telehealth: Payer: Self-pay

## 2024-02-23 ENCOUNTER — Encounter: Payer: Self-pay | Admitting: Oncology

## 2024-02-23 VITALS — BP 129/62 | HR 68 | Temp 97.6°F | Resp 20 | Wt 196.7 lb

## 2024-02-23 DIAGNOSIS — Z87891 Personal history of nicotine dependence: Secondary | ICD-10-CM | POA: Insufficient documentation

## 2024-02-23 DIAGNOSIS — Z85828 Personal history of other malignant neoplasm of skin: Secondary | ICD-10-CM | POA: Insufficient documentation

## 2024-02-23 DIAGNOSIS — C670 Malignant neoplasm of trigone of bladder: Secondary | ICD-10-CM | POA: Diagnosis not present

## 2024-02-23 MED ORDER — PROCHLORPERAZINE MALEATE 10 MG PO TABS: 10.0000 mg | ORAL_TABLET | Freq: Four times a day (QID) | ORAL | 1 refills | Status: AC | PRN

## 2024-02-23 NOTE — Telephone Encounter (Signed)
 Patient requesting a call from nurse to go over to discuss treatment.  Outbound call to patient; reviewed upcoming appointments, common side effects (I.e. dysuria, fatigue, et jaynie).  Also reviewed compazine was sent in the event he experiences N/V after starting treatment. Patient verbalized understanding.

## 2024-02-23 NOTE — Progress Notes (Signed)
Hematology/Oncology Consult note [facility] Telephone:([phone] [phone]  Patient Care Team: Amadeo Rush, MD as PCP - General (Internal Medicine)   Name of the patient: Caleb Davies  [id number]  17-Jun-1950    Reason for referral-superficial bladder cancer   Referring physician-Dr. Georganne  Date of visit: 02/23/2024   History of presenting illness- Caleb Davies is a 73 year old male with recently diagnosed high-grade non-muscle invasive bladder cancer presenting for oncology consultation regarding planned intravesical gemcitabine  therapy.  He initially presented in September 2025 with gross hematuria, which prompted an emergency department evaluation in September 2025. Renal ultrasound demonstrated showed eccentric left soft tissue echogenicity within the bladder highly concerning for urothelial carcinoma.   CT imaging was performed as part of the hematuria workup.  Which showed mild left bladder wall thickening.  No evidence of abdominopelvic metastatic disease.  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    Patient underwent transurethral resection of bladder tumor (TURBT) by Dr. Georganne  On 01/02/2024 which showed large papillary tumor with squamous/necrotic calcifications over the left trigone and left ureteral orifice.  He had a left ureteral stent placed at that time.  2 g intravesical gemcitabine  was instilled postoperatively.  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  Muscularis propria was present and not involved.  Patient underwent a second TURBT on 02/06/2024 which was negative for malignancy.  He currently denies hematuria and has no urinary symptoms, including dysuria, frequency, or urgency. There are no other genitourinary complaints at this time.  He expressed concern regarding the etiology of his bladder cancer, noting a  paternal history of bladder cancer and a significant history of tobacco use, including cigarette smoking from age 76 until 2009 and more recent use of chewing tobacco, which he has now discontinued.     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   Cellulitis of antihelix of left ear 11/29/2020   Kidney function test abnormal 07/27/2020   Bilateral impacted cerumen 01/28/2019   Shortness of breath 01/28/2019   History of colonic polyps    Polyp of ascending colon    Problems with swallowing and mastication    Stricture and stenosis of esophagus    Otorrhagia of right ear 09/04/2017   Abnormal lung sounds 02/13/2017   Cervical disc herniation 10/31/2012   Shoulder pain 10/16/2012   Erectile dysfunction 08/27/2012   GERD (gastroesophageal reflux disease) 08/27/2012   Routine history and physical examination of adult 08/27/2012   Diabetes mellitus (HCC) 04/12/2011   Hypertension, benign 04/12/2011   Cerebrovascular disease 07/09/2010   Hyperlipidemia 07/09/2010     Past Medical History:  Diagnosis Date   Bladder mass 12/12/2023   Bladder tumor    Cerebrovascular disease 07/09/2010   Cervical disc herniation 10/31/2012   Diabetes mellitus without complication [facility])    Erectile dysfunction 2014   GERD (gastroesophageal reflux disease)    Hyperlipidemia 2012   Hypertension  Otorrhagia of right ear 2019   Polyp of ascending colon    Polyp of transverse colon    Problems with swallowing and mastication    Stricture and  stenosis of esophagus    Stroke [facility]) 2009     Past Surgical History:  Procedure Laterality Date   ANTERIOR CERVICAL DECOMP/DISCECTOMY FUSION  2014   C4-7   BLADDER INSTILLATION N/A 01/02/2024   Procedure: INSTILLATION, BLADDER;  Surgeon: Georganne Penne SAUNDERS, MD;  Location: [facility] ORS;  Service: Urology;  Laterality: N/A;  Gemcitabine    COLONOSCOPY N/A 09/2013   repeat due 09/2018   COLONOSCOPY N/A 12/19/2023   Procedure: COLONOSCOPY;  Surgeon: Jinny Carmine, MD;  Location: [facility] ENDOSCOPY;  Service: Endoscopy;  Laterality: N/A;   COLONOSCOPY WITH PROPOFOL  N/A 10/29/2018   Procedure: COLONOSCOPY WITH BIOPSY;  Surgeon: Jinny Carmine, MD;  Location: [facility];  Service: Endoscopy;  Laterality: N/A;  DIABETIC doesnt want arrival time earlier than 0730   CYSTOSCOPY W/ URETERAL STENT PLACEMENT Left 01/02/2024   Procedure: CYSTOSCOPY, WITH RETROGRADE PYELOGRAM AND URETERAL STENT INSERTION;  Surgeon: Georganne Penne SAUNDERS, MD;  Location: [facility] ORS;  Service: Urology;  Laterality: Left;   CYSTOSCOPY W/ URETERAL STENT REMOVAL Left 02/06/2024   Procedure: REMOVAL, STENT, URETER, CYSTOSCOPIC;  Surgeon: Georganne Penne SAUNDERS, MD;  Location: [facility] ORS;  Service: Urology;  Laterality: Left;   CYSTOSCOPY WITH RETROGRADE PYELOGRAM, URETEROSCOPY AND STENT PLACEMENT Left 02/06/2024   Procedure: CYSTOURETEROSCOPY, WITH RETROGRADE PYELOGRAM;  Surgeon: Georganne Penne SAUNDERS, MD;  Location: [facility] ORS;  Service: Urology;  Laterality: Left;   ESOPHAGEAL DILATION N/A 10/29/2018   Procedure: ESOPHAGEAL DILATION;  Surgeon: Jinny Carmine, MD;  Location: [facility] SURGERY [facility];  Service: Endoscopy;  Laterality: N/A;   ESOPHAGOGASTRODUODENOSCOPY N/A 09/2013   ESOPHAGOGASTRODUODENOSCOPY (EGD) WITH PROPOFOL  N/A 10/29/2018   Procedure: ESOPHAGOGASTRODUODENOSCOPY (EGD) WITH DILATION;  Surgeon: Jinny Carmine, MD;  Location: [facility];  Service: Endoscopy;  Laterality: N/A;   POLYPECTOMY N/A 10/29/2018   Procedure: POLYPECTOMY;   Surgeon: Jinny Carmine, MD;  Location: [facility];  Service: Endoscopy;  Laterality: N/A;   POLYPECTOMY  12/19/2023   Procedure: POLYPECTOMY, INTESTINE;  Surgeon: Jinny Carmine, MD;  Location: [facility] ENDOSCOPY;  Service: Endoscopy;;   TRANSURETHRAL RESECTION OF BLADDER TUMOR N/A 01/02/2024   Procedure: TURBT (TRANSURETHRAL RESECTION OF BLADDER TUMOR);  Surgeon: Georganne Penne SAUNDERS, MD;  Location: [facility] ORS;  Service: Urology;  Laterality: N/A;   TRANSURETHRAL RESECTION OF BLADDER TUMOR N/A 02/06/2024   Procedure: TURBT (TRANSURETHRAL RESECTION OF BLADDER TUMOR);  Surgeon: Georganne Penne SAUNDERS, MD;  Location: [facility] ORS;  Service: Urology;  Laterality: N/A;    Social History   Socioeconomic History   Marital status: Divorced    Spouse name: Not on file   Number of children: Not on file   Years of education: Not on file   Highest education level: Not on file  Occupational History   Not on file  Tobacco Use   Smoking status: Former    Current packs/day: 0.00    Average packs/day: 1 pack/day for 40.0 years (40.0 ttl pk-yrs)    Types: Cigarettes    Start date: 52    Quit date: 2009    Years since quitting: 16.9   Smokeless tobacco: Former    Types: Chew    Quit date: 02/15/2024  Vaping Use   Vaping status: Never Used  Substance and Sexual Activity   Alcohol use: Not Currently    Comment: rarely   Drug use: Never   Sexual  activity: Not Currently    Birth control/protection: None  Other Topics Concern   Not on file  Social History Narrative   Not on file   Social Drivers of Health   Tobacco Use: Medium Risk (02/23/2024)   Patient History    Smoking Tobacco Use: Former    Smokeless Tobacco Use: Former    Passive Exposure: Not on Actuary Strain: Low Risk (05/18/2023)   Received from [facility]   Overall Financial Resource Strain (CARDIA)    Difficulty of Paying Living Expenses: Not hard at all  Food Insecurity: No Food Insecurity (02/23/2024)   Epic     Worried About Radiation Protection Practitioner of Food in the Last Year: Never true    Ran Out of Food in the Last Year: Never true  Transportation Needs: No Transportation Needs (02/23/2024)   Epic    Lack of Transportation (Medical): No    Lack of Transportation (Non-Medical): No  Physical Activity: Insufficiently Active (01/17/2024)   Received from [facility]   Exercise Vital Sign    On average, how many days per week do you engage in moderate to strenuous exercise (like a brisk walk)?: 3 days    On average, how many minutes do you engage in exercise at this level?: 30 min  Stress: No Stress Concern Present (01/17/2024)   Received from [facility] - Occupational Stress Questionnaire    Do you feel stress - tense, restless, nervous, or anxious, or unable to sleep at night because your mind is troubled all the time - these days?: Not at all  Social Connections: Not on file  Intimate Partner Violence: Not At Risk (02/23/2024)   Epic    Fear of Current or Ex-Partner: No    Emotionally Abused: No    Physically Abused: No    Sexually Abused: No  Depression (PHQ2-9): Low Risk (02/23/2024)   Depression (PHQ2-9)    PHQ-2 Score: 0  Alcohol Screen: Not on file  Housing: Low Risk (02/23/2024)   Epic    Unable to Pay for Housing in the Last Year: No    Number of Times Moved in the Last Year: 0    Homeless in the Last Year: No  Utilities: Not At Risk (02/23/2024)   Epic    Threatened with loss of utilities: No  Health Literacy: Medium Risk (05/18/2023)   Received from [facility] Literacy    How often do you need to have someone help you when you read instructions, pamphlets, or other written material from your doctor or pharmacy?: Rarely     Family History  Problem Relation Age of Onset   Cancer Father     Current Medications[2]   Physical exam:  Vitals:   02/23/24 1324  BP: 129/62  Pulse: 68  Resp: 20  Temp: 97.6 F (36.4 C)   SpO2: 100%  Weight: 196 lb 11.2 oz (89.2 kg)   Physical Exam        Latest Ref Rng & Units 02/01/2024    2:02 PM  CMP  Glucose 70 - 99 mg/dL 845   BUN 8 - 23 mg/dL 13   Creatinine 9.38 - 1.24 mg/dL 8.60   Sodium 864 - 854 mmol/L 143   Potassium 3.5 - 5.1 mmol/L 4.0   Chloride 98 - 111 mmol/L 107   CO2 22 - 32 mmol/L 25   Calcium 8.9 - 10.3 mg/dL 9.4  Latest Ref Rng & Units 12/03/2023    9:00 AM  CBC  WBC 4.0 - 10.5 K/uL 5.2   Hemoglobin 13.0 - 17.0 g/dL 86.1   Hematocrit 60.9 - 52.0 % 39.0   Platelets 150 - 400 K/uL 213     No images are attached to the encounter.  DG OR UROLOGY CYSTO IMAGE ([facility] ONLY) Result Date: 02/06/2024 There is no interpretation for this exam.  This order is for images obtained during a surgical procedure.  Please See Surgeries Tab for more information regarding the procedure.    Assessment and plan- Patient is a 73 y.o. male referred for superficial bladder carcinoma for consideration of intravesical gemcitabine   Assessment and Plan    High-grade non-muscle invasive bladder cancer High-grade non-muscle invasive bladder cancer confirmed by TURBT, no muscle invasion, aggressive but confined to urothelium. Tobacco use significant risk factor. - Intravesical gemcitabine  chemotherapy weekly for six weeks.  Subsequent treatment dosage and duration will be determined if needed by urology.  Treatment will be given with a curative intent  - Cochrane be systematic review of 7 randomized trials with 1-2 2 participants found Giemsa to be may reduce recurrence risk as compared to saline 39% versus 47% recurrence rate hazard ratio 0.77.  This wall [phone] trial which looked at 2 g gemcitabine  weekly for 6 weeks followed by consideration for maintenance doses showed that at 4-month evaluation 47% with disease-free, 28% at 1 year and 21% to 2 years out of which 89% and high risk disease  - Blood work and urine testing before each treatment. -  Post-treatment follow-up with urology three months after chemotherapy. - Discussed potential side effects: local irritation, dysuria. - Instructed to contact urology for adverse effects.    -Patient wishes to start this treatment after holidays and his first treatment will be on 03/20/2023   Cancer Staging  Bladder cancer [facility]) Staging form: Urinary Bladder, AJCC 8th Edition - Clinical stage from 02/23/2024: Stage I (cT1, cN0, cM0) - Signed by Melanee Annah BROCKS, MD on 02/23/2024 WHO/ISUP grade (low/high): High Grade Histologic grading system: 2 grade system    Thank you for this kind referral and the opportunity to participate in the care of this patient   Visit Diagnosis 1. Malignant neoplasm of trigone of urinary bladder (HCC)     Dr. Annah Melanee, MD, MPH [facility] [phone] 02/23/2024 Not otherwise examined today.                   [1]  Allergies Allergen Reactions   Trazodone Swelling   Amlodipine     Edema   Hydrochlorothiazide Other (See Comments)   Metformin     Abdominal pain   Codeine Nausea Only  [2]  Current Outpatient Medications:    aspirin 81 MG chewable tablet, Chew by mouth daily., Disp: , Rfl:    atorvastatin (LIPITOR) 40 MG tablet, Take 40 mg by mouth daily., Disp: , Rfl:    glucose blood (CONTOUR NEXT TEST) test strip, by Other route., Disp: , Rfl:    metoprolol succinate (TOPROL-XL) 25 MG 24 hr tablet, Take 25 mg by mouth daily., Disp: , Rfl:    olmesartan (BENICAR) 40 MG tablet, Take 40 mg by mouth daily., Disp: , Rfl:    pantoprazole (PROTONIX) 40 MG tablet, TAKE 1 TABLET BY MOUTH EVERY DAY, Disp: , Rfl:    tamsulosin  (FLOMAX ) 0.4 MG CAPS capsule, Take 1 capsule (0.4 mg total) by mouth daily., Disp: 90 capsule, Rfl:  3 ° °

## 2024-02-25 ENCOUNTER — Other Ambulatory Visit: Payer: Self-pay

## 2024-03-05 NOTE — Progress Notes (Signed)
 Pharmacist Chemotherapy Monitoring - Initial Assessment    Anticipated start date: 03/19/24   The following has been reviewed per standard work regarding the patient's treatment regimen: The patient's diagnosis, treatment plan and drug doses, and organ/hematologic function Lab orders and baseline tests specific to treatment regimen  The treatment plan start date, drug sequencing, and pre-medications Prior authorization status  Patient's documented medication list, including drug-drug interaction screen and prescriptions for anti-emetics and supportive care specific to the treatment regimen The drug concentrations, fluid compatibility, administration routes, and timing of the medications to be used The patient's access for treatment and lifetime cumulative dose history, if applicable  The patient's medication allergies and previous infusion related reactions, if applicable   Changes made to treatment plan:  N/A  Follow up needed:  N/A   Maudie FORBES Andreas, PharmD, BCPS Clinical Pharmacist   03/05/2024  3:49 PM

## 2024-03-12 ENCOUNTER — Inpatient Hospital Stay

## 2024-03-12 NOTE — Telephone Encounter (Signed)
 Called patient to verify that he did stop taking flomax . Pt stated his last dose was 12/28. He stated his symptoms started almost immediately after starting the medication. This included throat swelling and hoarseness, tongue swelling, sneezing. He thought at first it was seasonal allergies but then realized it would get worse after taking his flomax  separately from his other medications. Pt was encouraged to contact his PCP to verify that taking benadryl might help with his lingering side effects. Pt still sounds like he has throat and tongue swelling. Pt was instructed that he needs to seek immediate ER care if his swelling gets worse and he has issues breathing. Pt voiced understanding and that he will reach out to his PCP to see about the benadryl. I let pt know we will add this to his list of allergies

## 2024-03-15 NOTE — Progress Notes (Addendum)
 Subjective Patient ID: Caleb Davies is a 74 y.o. male.    Patient presents for airway concerns as advised by nurse from urology office where urinary stent placed.  Describes complicated urinary procedures for bladder cancer starting in October, Started Tamulosin end of November, started feeling throat swelling sensation early December that has continued for about 3 weeks, improved off tamsulosin  for last 5-6 days. Patient suspects related to Tamsulosin , but has a history of throat stricture that required stretching.  No respiratory distress, describes PND complaints at home, sniffling, and states improving. Voice change noted. Denies any problems eating or drinking. History of chronic chewing tobacco use.    History provided by:  Patient Language interpreter used: No   Allergic Reaction   Review of Systems  HENT:  Positive for sore throat.     Patient History  Allergies: Allergies  Allergen Reactions   Trazodone Swelling   Hydrochlorothiazide    Codeine Nausea Only    Other reaction(s): NAUSEA Other reaction(s): NAUSEA     Past Medical History:  Diagnosis Date   Diabetes mellitus    Hypertension    History reviewed. No pertinent surgical history. Social History   Socioeconomic History   Marital status: Not on file    Spouse name: Not on file   Number of children: Not on file   Years of education: Not on file   Highest education level: Not on file  Occupational History   Not on file  Tobacco Use   Smoking status: Never   Smokeless tobacco: Never  Substance and Sexual Activity   Alcohol use: Not Currently   Drug use: Not on file   Sexual activity: Not on file  Other Topics Concern   Not on file  Social History Narrative   Not on file   History reviewed. No pertinent family history. Current Outpatient Medications on File Prior to Visit  Medication Sig Dispense Refill   aspirin 81 MG EC tablet Take 81 mg by mouth.     atorvastatin  (Lipitor) 40 MG tablet Take 40 mg by mouth 1 (one) time each day.     losartan (Cozaar) 100 MG tablet Take 100 mg by mouth 1 (one) time each day.     metoprolol succinate XL (Toprol-XL) 25 MG 24 hr tablet Take 1 tablet by mouth 1 (one) time each day.     amLODIPine (Norvasc) 10 MG tablet Take 1 tablet by mouth 1 (one) time each day. (Patient not taking: Reported on 03/15/2024)     cephalexin (Keflex) 500 MG capsule TAKE 1 CAPSULE (500 MG TOTAL) BY MOUTH THREE (3) TIMES A DAY FOR 14 DAYS. (Patient not taking: Reported on 03/15/2024)     metFORMIN (Glucophage) 500 MG tablet Take by mouth. (Patient not taking: Reported on 03/15/2024)     No current facility-administered medications on file prior to visit.    Objective  Vitals:   03/15/24 1713  BP: (!) 148/74  Pulse: (!) 59  Resp: 18  Temp: 36.4 C (97.6 F)  SpO2: 98%  Weight: 89.8 kg  Height: 5' 8  PainSc: 0-No pain               No results found.  Physical Exam Vitals and nursing note reviewed.  Constitutional:      General: He is not in acute distress.    Appearance: Normal appearance.  HENT:     Head: Normocephalic.     Nose: Rhinorrhea present.     Mouth/Throat:  Mouth: Mucous membranes are moist.     Pharynx: Oropharynx is clear. No posterior oropharyngeal erythema.     Comments: Lack of normal structures noted, no uvula identified.  Tongue appears to extend into the tonsillar pillars and the back and does not present with normal anatomy appearance.  However airway is patent, no erythema,, no tenderness, no PTA.  Underside of tongue on the left side has a erythematous patch of concern, but does not look infected. Eyes:     Extraocular Movements: Extraocular movements intact.     Conjunctiva/sclera: Conjunctivae normal.  Cardiovascular:     Rate and Rhythm: Normal rate.  Pulmonary:     Effort: Pulmonary effort is normal.  Abdominal:     General: There is no distension.  Musculoskeletal:        General: Normal  range of motion.     Cervical back: Normal range of motion. No tenderness.  Lymphadenopathy:     Cervical: No cervical adenopathy.  Skin:    General: Skin is warm.  Neurological:     Mental Status: He is alert and oriented to person, place, and time.  Psychiatric:        Mood and Affect: Mood normal.        Behavior: Behavior normal.     Results for orders placed or performed in visit on 03/15/24  POCT rapid strep A manually resulted  Component Result   Rapid Strep A Screen Negative   Internal Quality Control Pass       Procedures MDM:     1 Undiagnosed new problem with uncertain prognosis     Explanation of Medical Decision Making and variances from expected care:  Given history of chewing tobacco use and bladder cancer cannot rule out tongue cancer, given history of improvement, patent airway no respiratory distress, postnasal drip complaints, no emergent findings of concern do not see need for emergency room referral.  However discussed with patient concern for inflammation and need for follow-up explained to patient that cannot rule out potential obstructions, stricture, cancers, other pathology.  Patient stated understanding and states will follow-up.  Strict ER precautions for worsening and presentation before, gets really bad.   Assessment requiring historian other than patient: No     Independent visualization of image, tracing, or test: No     Discussion of management with another provider: No     Risk:: Moderate            Assessment/Plan Diagnoses and all orders for this visit:  Throat tightness -     dexamethasone  (Decadron ) injection 10 mg  Other orders -     POCT rapid strep A manually resulted     Disposition Status: Home  Patient Instructions  FOR ANY WORSENING CONDITIONS PRESENT TO ER IMMEDIATELY, otherwise follow up with PCM/ENT next week.   Progress note signed by Debby Rung, PA on 03/15/24 at  7:18 PM

## 2024-03-15 NOTE — Progress Notes (Addendum)
 Pt presents with a allergic reaction flomax . Pt stated the flomax  caused issues with his throat(closure)

## 2024-03-19 ENCOUNTER — Inpatient Hospital Stay: Attending: Oncology

## 2024-03-19 ENCOUNTER — Inpatient Hospital Stay: Admitting: Oncology

## 2024-03-19 ENCOUNTER — Encounter: Payer: Self-pay | Admitting: Oncology

## 2024-03-19 ENCOUNTER — Inpatient Hospital Stay

## 2024-03-19 ENCOUNTER — Other Ambulatory Visit: Payer: Self-pay | Admitting: Oncology

## 2024-03-19 VITALS — BP 133/87 | HR 73 | Temp 97.5°F | Resp 19 | Ht 70.0 in | Wt 197.5 lb

## 2024-03-19 VITALS — BP 141/74 | HR 67

## 2024-03-19 DIAGNOSIS — Z87891 Personal history of nicotine dependence: Secondary | ICD-10-CM | POA: Insufficient documentation

## 2024-03-19 DIAGNOSIS — Z5111 Encounter for antineoplastic chemotherapy: Secondary | ICD-10-CM

## 2024-03-19 DIAGNOSIS — C670 Malignant neoplasm of trigone of bladder: Secondary | ICD-10-CM

## 2024-03-19 DIAGNOSIS — C671 Malignant neoplasm of dome of bladder: Secondary | ICD-10-CM

## 2024-03-19 LAB — CBC WITH DIFFERENTIAL (CANCER CENTER ONLY)
Abs Immature Granulocytes: 0.02 K/uL (ref 0.00–0.07)
Basophils Absolute: 0 K/uL (ref 0.0–0.1)
Basophils Relative: 1 %
Eosinophils Absolute: 0.6 K/uL — ABNORMAL HIGH (ref 0.0–0.5)
Eosinophils Relative: 11 %
HCT: 38.8 % — ABNORMAL LOW (ref 39.0–52.0)
Hemoglobin: 13.7 g/dL (ref 13.0–17.0)
Immature Granulocytes: 0 %
Lymphocytes Relative: 20 %
Lymphs Abs: 1.2 K/uL (ref 0.7–4.0)
MCH: 31.9 pg (ref 26.0–34.0)
MCHC: 35.3 g/dL (ref 30.0–36.0)
MCV: 90.4 fL (ref 80.0–100.0)
Monocytes Absolute: 0.6 K/uL (ref 0.1–1.0)
Monocytes Relative: 9 %
Neutro Abs: 3.5 K/uL (ref 1.7–7.7)
Neutrophils Relative %: 59 %
Platelet Count: 202 K/uL (ref 150–400)
RBC: 4.29 MIL/uL (ref 4.22–5.81)
RDW: 13 % (ref 11.5–15.5)
WBC Count: 5.9 K/uL (ref 4.0–10.5)
nRBC: 0 % (ref 0.0–0.2)

## 2024-03-19 LAB — URINALYSIS, COMPLETE (UACMP) WITH MICROSCOPIC
Bacteria, UA: NONE SEEN
Bilirubin Urine: NEGATIVE
Glucose, UA: NEGATIVE mg/dL
Hgb urine dipstick: NEGATIVE
Ketones, ur: NEGATIVE mg/dL
Leukocytes,Ua: NEGATIVE
Nitrite: NEGATIVE
Protein, ur: NEGATIVE mg/dL
Specific Gravity, Urine: 1.02 (ref 1.005–1.030)
Squamous Epithelial / HPF: 0 /HPF (ref 0–5)
pH: 5 (ref 5.0–8.0)

## 2024-03-19 LAB — CMP (CANCER CENTER ONLY)
ALT: 15 U/L (ref 0–44)
AST: 17 U/L (ref 15–41)
Albumin: 4 g/dL (ref 3.5–5.0)
Alkaline Phosphatase: 84 U/L (ref 38–126)
Anion gap: 10 (ref 5–15)
BUN: 16 mg/dL (ref 8–23)
CO2: 25 mmol/L (ref 22–32)
Calcium: 9.5 mg/dL (ref 8.9–10.3)
Chloride: 108 mmol/L (ref 98–111)
Creatinine: 1.46 mg/dL — ABNORMAL HIGH (ref 0.61–1.24)
GFR, Estimated: 51 mL/min — ABNORMAL LOW
Glucose, Bld: 167 mg/dL — ABNORMAL HIGH (ref 70–99)
Potassium: 4.5 mmol/L (ref 3.5–5.1)
Sodium: 143 mmol/L (ref 135–145)
Total Bilirubin: 1.5 mg/dL — ABNORMAL HIGH (ref 0.0–1.2)
Total Protein: 7.1 g/dL (ref 6.5–8.1)

## 2024-03-19 MED ORDER — GEMCITABINE CHEMO FOR BLADDER INSTILLATION 2000 MG
2000.0000 mg | Freq: Once | INTRAVENOUS | Status: AC
  Administered 2024-03-19: 2000 mg via INTRAVESICAL
  Filled 2024-03-19: qty 52.6

## 2024-03-19 MED ORDER — PROCHLORPERAZINE MALEATE 10 MG PO TABS
10.0000 mg | ORAL_TABLET | Freq: Once | ORAL | Status: AC
  Administered 2024-03-19: 10 mg via ORAL
  Filled 2024-03-19: qty 1

## 2024-03-19 MED ORDER — OXYBUTYNIN CHLORIDE 5 MG PO TABS
5.0000 mg | ORAL_TABLET | Freq: Once | ORAL | Status: AC | PRN
  Administered 2024-03-19: 5 mg via ORAL
  Filled 2024-03-19: qty 1

## 2024-03-19 NOTE — Progress Notes (Signed)
 Patient is upset because he said he was told a nurse would call him with instructions about today, he is upset. Just giving a head's up to everyone incase he seems irritable.

## 2024-03-19 NOTE — Patient Instructions (Signed)
 Gemcitabine Injection What is this medication? GEMCITABINE (jem SYE ta been) treats some types of cancer. It works by slowing down the growth of cancer cells. This medicine may be used for other purposes; ask your health care provider or pharmacist if you have questions. COMMON BRAND NAME(S): Gemzar, Infugem What should I tell my care team before I take this medication? They need to know if you have any of these conditions: Blood disorders Infection Kidney disease Liver disease Lung or breathing disease, such as asthma or COPD Recent or ongoing radiation therapy An unusual or allergic reaction to gemcitabine, other medications, foods, dyes, or preservatives If you or your partner are pregnant or trying to get pregnant Breast-feeding How should I use this medication? This medication is injected into a vein. It is given by your care team in a hospital or clinic setting. Talk to your care team about the use of this medication in children. Special care may be needed. Overdosage: If you think you have taken too much of this medicine contact a poison control center or emergency room at once. NOTE: This medicine is only for you. Do not share this medicine with others. What if I miss a dose? Keep appointments for follow-up doses. It is important not to miss your dose. Call your care team if you are unable to keep an appointment. What may interact with this medication? Interactions have not been studied. This list may not describe all possible interactions. Give your health care provider a list of all the medicines, herbs, non-prescription drugs, or dietary supplements you use. Also tell them if you smoke, drink alcohol, or use illegal drugs. Some items may interact with your medicine. What should I watch for while using this medication? Your condition will be monitored carefully while you are receiving this medication. This medication may make you feel generally unwell. This is not uncommon, as  chemotherapy can affect healthy cells as well as cancer cells. Report any side effects. Continue your course of treatment even though you feel ill unless your care team tells you to stop. In some cases, you may be given additional medications to help with side effects. Follow all directions for their use. This medication may increase your risk of getting an infection. Call your care team for advice if you get a fever, chills, sore throat, or other symptoms of a cold or flu. Do not treat yourself. Try to avoid being around people who are sick. This medication may increase your risk to bruise or bleed. Call your care team if you notice any unusual bleeding. Be careful brushing or flossing your teeth or using a toothpick because you may get an infection or bleed more easily. If you have any dental work done, tell your dentist you are receiving this medication. Avoid taking medications that contain aspirin, acetaminophen, ibuprofen, naproxen, or ketoprofen unless instructed by your care team. These medications may hide a fever. Talk to your care team if you or your partner wish to become pregnant or think you might be pregnant. This medication can cause serious birth defects if taken during pregnancy and for 6 months after the last dose. A negative pregnancy test is required before starting this medication. A reliable form of contraception is recommended while taking this medication and for 6 months after the last dose. Talk to your care team about effective forms of contraception. Do not father a child while taking this medication and for 3 months after the last dose. Use a condom while having sex  during this time period. Do not breastfeed while taking this medication and for at least 1 week after the last dose. This medication may cause infertility. Talk to your care team if you are concerned about your fertility. What side effects may I notice from receiving this medication? Side effects that you should  report to your care team as soon as possible: Allergic reactions--skin rash, itching, hives, swelling of the face, lips, tongue, or throat Capillary leak syndrome--stomach or muscle pain, unusual weakness or fatigue, feeling faint or lightheaded, decrease in the amount of urine, swelling of the ankles, hands, or feet, trouble breathing Infection--fever, chills, cough, sore throat, wounds that don't heal, pain or trouble when passing urine, general feeling of discomfort or being unwell Liver injury--right upper belly pain, loss of appetite, nausea, light-colored stool, dark yellow or brown urine, yellowing skin or eyes, unusual weakness or fatigue Low red blood cell level--unusual weakness or fatigue, dizziness, headache, trouble breathing Lung injury--shortness of breath or trouble breathing, cough, spitting up blood, chest pain, fever Stomach pain, bloody diarrhea, pale skin, unusual weakness or fatigue, decrease in the amount of urine, which may be signs of hemolytic uremic syndrome Sudden and severe headache, confusion, change in vision, seizures, which may be signs of posterior reversible encephalopathy syndrome (PRES) Unusual bruising or bleeding Side effects that usually do not require medical attention (report to your care team if they continue or are bothersome): Diarrhea Drowsiness Hair loss Nausea Pain, redness, or swelling with sores inside the mouth or throat Vomiting This list may not describe all possible side effects. Call your doctor for medical advice about side effects. You may report side effects to FDA at 1-800-FDA-1088. Where should I keep my medication? This medication is given in a hospital or clinic. It will not be stored at home. NOTE: This sheet is a summary. It may not cover all possible information. If you have questions about this medicine, talk to your doctor, pharmacist, or health care provider.  2024 Elsevier/Gold Standard (2021-07-06 00:00:00)

## 2024-03-19 NOTE — Progress Notes (Signed)
 "    Hematology/Oncology Consult note [facility]  [phone] [phone]  Patient Care Team: Amadeo Rush, MD as PCP - General (Internal Medicine) Melanee Annah BROCKS, MD as Consulting Physician (Oncology)   Name of the patient: Caleb Davies  [id number]  06/02/1950   Date of visit: 03/19/2024  Diagnosis-  Cancer Staging  Bladder cancer [facility]) Staging form: Urinary Bladder, AJCC 8th Edition - Clinical stage from 02/23/2024: Stage I (cT1, cN0, cM0) - Signed by Melanee Annah BROCKS, MD on 02/23/2024 WHO/ISUP grade (low/high): High Grade Histologic grading system: 2 grade system    Chief complaint/ Reason for visit-on treatment assessment prior to cycle 1 of weekly gemcitabine  chemotherapy  Heme/Onc history: Caleb Davies is a 74 year old male with recently diagnosed high-grade non-muscle invasive bladder cancer presenting for oncology consultation regarding planned intravesical gemcitabine  therapy.   He initially presented in September 2025 with gross hematuria, which prompted an emergency department evaluation in September 2025. Renal ultrasound demonstrated showed eccentric left soft tissue echogenicity within the bladder highly concerning for urothelial carcinoma.    CT imaging was performed as part of the hematuria workup.  Which showed mild left bladder wall thickening.  No evidence of abdominopelvic metastatic disease.  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     Patient underwent transurethral resection of bladder tumor (TURBT) by Dr. Georganne  On 01/02/2024 which showed large papillary tumor with squamous/necrotic calcifications over the left trigone and left ureteral orifice.  He had a left ureteral stent placed at that time.  2 g intravesical gemcitabine  was instilled postoperatively.  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  Muscularis  propria was present and not involved.  Patient underwent a second TURBT on 02/06/2024 which was negative for malignancy.   He currently denies hematuria and has no urinary symptoms, including dysuria, frequency, or urgency. There are no other genitourinary complaints at this time.  Interval history-presently patient is doing well.  Denies any urinary symptoms at this time.  Denies any hematuria  ECOG PS- 1 Pain scale- 0   Review of systems- Review of Systems  Constitutional:  Negative for chills, fever, malaise/fatigue and weight loss.  HENT:  Negative for congestion, ear discharge and nosebleeds.   Eyes:  Negative for blurred vision.  Respiratory:  Negative for cough, hemoptysis, sputum production, shortness of breath and wheezing.   Cardiovascular:  Negative for chest pain, palpitations, orthopnea and claudication.  Gastrointestinal:  Negative for abdominal pain, blood in stool, constipation, diarrhea, heartburn, melena, nausea and vomiting.  Genitourinary:  Negative for dysuria, flank pain, frequency, hematuria and urgency.  Musculoskeletal:  Negative for back pain, joint pain and myalgias.  Skin:  Negative for rash.  Neurological:  Negative for dizziness, tingling, focal weakness, seizures, weakness and headaches.  Endo/Heme/Allergies:  Does not bruise/bleed easily.  Psychiatric/Behavioral:  Negative for depression and suicidal ideas. The patient does not have insomnia.       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   Diabetes mellitus without complication [facility])    Erectile dysfunction 2014   GERD (gastroesophageal reflux disease)    Hyperlipidemia 2012   Hypertension    Otorrhagia of right ear 2019   Polyp of ascending colon    Polyp of transverse colon    Problems with swallowing  and mastication    Stricture and stenosis of esophagus     Stroke [facility]) 2009     Past Surgical History:  Procedure Laterality Date   ANTERIOR CERVICAL DECOMP/DISCECTOMY FUSION  2014   C4-7   BLADDER INSTILLATION N/A 01/02/2024   Procedure: INSTILLATION, BLADDER;  Surgeon: Georganne Penne SAUNDERS, MD;  Location: [facility] ORS;  Service: Urology;  Laterality: N/A;  Gemcitabine    COLONOSCOPY N/A 09/2013   repeat due 09/2018   COLONOSCOPY N/A 12/19/2023   Procedure: COLONOSCOPY;  Surgeon: Jinny Carmine, MD;  Location: [facility] ENDOSCOPY;  Service: Endoscopy;  Laterality: N/A;   COLONOSCOPY WITH PROPOFOL  N/A 10/29/2018   Procedure: COLONOSCOPY WITH BIOPSY;  Surgeon: Jinny Carmine, MD;  Location: [facility] SURGERY CNTR;  Service: Endoscopy;  Laterality: N/A;  DIABETIC doesnt want arrival time earlier than 0730   CYSTOSCOPY W/ URETERAL STENT PLACEMENT Left 01/02/2024   Procedure: CYSTOSCOPY, WITH RETROGRADE PYELOGRAM AND URETERAL STENT INSERTION;  Surgeon: Georganne Penne SAUNDERS, MD;  Location: [facility] ORS;  Service: Urology;  Laterality: Left;   CYSTOSCOPY W/ URETERAL STENT REMOVAL Left 02/06/2024   Procedure: REMOVAL, STENT, URETER, CYSTOSCOPIC;  Surgeon: Georganne Penne SAUNDERS, MD;  Location: [facility] ORS;  Service: Urology;  Laterality: Left;   CYSTOSCOPY WITH RETROGRADE PYELOGRAM, URETEROSCOPY AND STENT PLACEMENT Left 02/06/2024   Procedure: CYSTOURETEROSCOPY, WITH RETROGRADE PYELOGRAM;  Surgeon: Georganne Penne SAUNDERS, MD;  Location: [facility] ORS;  Service: Urology;  Laterality: Left;   ESOPHAGEAL DILATION N/A 10/29/2018   Procedure: ESOPHAGEAL DILATION;  Surgeon: Jinny Carmine, MD;  Location: [facility] SURGERY CNTR;  Service: Endoscopy;  Laterality: N/A;   ESOPHAGOGASTRODUODENOSCOPY N/A 09/2013   ESOPHAGOGASTRODUODENOSCOPY (EGD) WITH PROPOFOL  N/A 10/29/2018   Procedure: ESOPHAGOGASTRODUODENOSCOPY (EGD) WITH DILATION;  Surgeon: Jinny Carmine, MD;  Location: [facility] SURGERY [facility];  Service: Endoscopy;  Laterality: N/A;   POLYPECTOMY N/A 10/29/2018   Procedure: POLYPECTOMY;  Surgeon: Jinny Carmine, MD;   Location: [facility] SURGERY CNTR;  Service: Endoscopy;  Laterality: N/A;   POLYPECTOMY  12/19/2023   Procedure: POLYPECTOMY, INTESTINE;  Surgeon: Jinny Carmine, MD;  Location: [facility] ENDOSCOPY;  Service: Endoscopy;;   TRANSURETHRAL RESECTION OF BLADDER TUMOR N/A 01/02/2024   Procedure: TURBT (TRANSURETHRAL RESECTION OF BLADDER TUMOR);  Surgeon: Georganne Penne SAUNDERS, MD;  Location: [facility] ORS;  Service: Urology;  Laterality: N/A;   TRANSURETHRAL RESECTION OF BLADDER TUMOR N/A 02/06/2024   Procedure: TURBT (TRANSURETHRAL RESECTION OF BLADDER TUMOR);  Surgeon: Georganne Penne SAUNDERS, MD;  Location: [facility] ORS;  Service: Urology;  Laterality: N/A;    Social History   Socioeconomic History   Marital status: Divorced    Spouse name: Not on file   Number of children: Not on file   Years of education: Not on file   Highest education level: Not on file  Occupational History   Not on file  Tobacco Use   Smoking status: Former    Current packs/day: 0.00    Average packs/day: 1 pack/day for 40.0 years (40.0 ttl pk-yrs)    Types: Cigarettes    Start date: 60    Quit date: 2009    Years since quitting: 17.0   Smokeless tobacco: Former    Types: Chew    Quit date: 02/15/2024  Vaping Use   Vaping status: Never Used  Substance and Sexual Activity   Alcohol use: Not Currently    Comment: rarely   Drug use: Never   Sexual activity: Not Currently    Birth control/protection: None  Other Topics Concern   Not on file  Social History Narrative  Not on file   Social Drivers of Health   Tobacco Use: Medium Risk (03/19/2024)   Patient History    Smoking Tobacco Use: Former    Smokeless Tobacco Use: Former    Passive Exposure: Not on Actuary Strain: Low Risk (05/18/2023)   Received from [facility]   Overall Financial Resource Strain (CARDIA)    Difficulty of Paying Living Expenses: Not hard at all  Food Insecurity: No Food Insecurity (02/23/2024)   Epic    Worried About Radiation Protection Practitioner of  Food in the Last Year: Never true    Ran Out of Food in the Last Year: Never true  Transportation Needs: No Transportation Needs (02/23/2024)   Epic    Lack of Transportation (Medical): No    Lack of Transportation (Non-Medical): No  Physical Activity: Insufficiently Active (01/17/2024)   Received from [facility]   Exercise Vital Sign    On average, how many days per week do you engage in moderate to strenuous exercise (like a brisk walk)?: 3 days    On average, how many minutes do you engage in exercise at this level?: 30 min  Stress: No Stress Concern Present (01/17/2024)   Received from [facility] - Occupational Stress Questionnaire    Do you feel stress - tense, restless, nervous, or anxious, or unable to sleep at night because your mind is troubled all the time - these days?: Not at all  Social Connections: Not on file  Intimate Partner Violence: Not At Risk (02/23/2024)   Epic    Fear of Current or Ex-Partner: No    Emotionally Abused: No    Physically Abused: No    Sexually Abused: No  Depression (PHQ2-9): Low Risk (03/19/2024)   Depression (PHQ2-9)    PHQ-2 Score: 0  Alcohol Screen: Not on file  Housing: Low Risk (02/23/2024)   Epic    Unable to Pay for Housing in the Last Year: No    Number of Times Moved in the Last Year: 0    Homeless in the Last Year: No  Utilities: Not At Risk (02/23/2024)   Epic    Threatened with loss of utilities: No  Health Literacy: Medium Risk (05/18/2023)   Received from [facility]    How often do you need to have someone help you when you read instructions, pamphlets, or other written material from your doctor or pharmacy?: Rarely    Family History  Problem Relation Age of Onset   Cancer Father     Current Medications[2]  Physical exam:  Vitals:   03/19/24 0929  BP: 133/87  Pulse: 73  Resp: 19  Temp: (!) 97.5 F (36.4 C)  TempSrc: Tympanic  SpO2: 97%   Weight: 197 lb 8.5 oz (89.6 kg)  Height: 5' 10 (1.778 m)   Physical Exam Cardiovascular:     Rate and Rhythm: Normal rate and regular rhythm.     Heart sounds: Normal heart sounds.  Pulmonary:     Effort: Pulmonary effort is normal.  Skin:    General: Skin is warm and dry.  Neurological:     Mental Status: He is alert and oriented to person, place, and time.      I have personally reviewed labs listed below:    Latest Ref Rng & Units 03/19/2024    9:01 AM  CMP  Glucose 70 - 99 mg/dL 832   BUN  8 - 23 mg/dL 16   Creatinine 9.38 - 1.24 mg/dL 8.53   Sodium 864 - 854 mmol/L 143   Potassium 3.5 - 5.1 mmol/L 4.5   Chloride 98 - 111 mmol/L 108   CO2 22 - 32 mmol/L 25   Calcium 8.9 - 10.3 mg/dL 9.5   Total Protein 6.5 - 8.1 g/dL 7.1   Total Bilirubin 0.0 - 1.2 mg/dL 1.5   Alkaline Phos 38 - 126 U/L 84   AST 15 - 41 U/L 17   ALT 0 - 44 U/L 15       Latest Ref Rng & Units 03/19/2024    9:02 AM  CBC  WBC 4.0 - 10.5 K/uL 5.9   Hemoglobin 13.0 - 17.0 g/dL 86.2   Hematocrit 60.9 - 52.0 % 38.8   Platelets 150 - 400 K/uL 202     Assessment and plan- Patient is a 73 y.o. male with history of superficial bladder cancer here for on treatment assessment prior to cycle 1 of weekly intravesical gemcitabine  chemotherapy  Counts okay to proceed with cycle 1 of intravesical gemcitabine  chemotherapy today.  He will be getting 6 weekly intravesical cycles.  He will directly proceed for treatment next week and I will see him back in 2 weeks for cycle 3.  Following 6 treatments he will not require any follow-up with me and will continue to follow-up with Dr. Perla for surveillance cystoscopy.  Patient will reach out to urology if he experiences any side effects following today's treatment   Visit Diagnosis 1. Malignant neoplasm of dome of urinary bladder (HCC)   2. Encounter for antineoplastic chemotherapy      Dr. Annah Skene, MD, MPH [facility] [phone] 03/19/2024 1:43 PM                   [1]  Allergies Allergen Reactions   Trazodone Swelling   Amlodipine     Edema   Hydrochlorothiazide Other (See Comments)   Metformin     Abdominal pain   Tamsulosin      angioedema   Codeine Nausea Only  [2]  Current Outpatient Medications:    aspirin 81 MG chewable tablet, Chew by mouth daily., Disp: , Rfl:    atorvastatin (LIPITOR) 40 MG tablet, Take 40 mg by mouth daily., Disp: , Rfl:    glucose blood (CONTOUR NEXT TEST) test strip, by Other route., Disp: , Rfl:    metoprolol succinate (TOPROL-XL) 25 MG 24 hr tablet, Take 25 mg by mouth daily., Disp: , Rfl:    olmesartan (BENICAR) 40 MG tablet, Take 40 mg by mouth daily., Disp: , Rfl:    pantoprazole (PROTONIX) 40 MG tablet, TAKE 1 TABLET BY MOUTH EVERY DAY, Disp: , Rfl:    prochlorperazine  (COMPAZINE ) 10 MG tablet, Take 1 tablet (10 mg total) by mouth every 6 (six) hours as needed for nausea or vomiting., Disp: 30 tablet, Rfl: 1   tamsulosin  (FLOMAX ) 0.4 MG CAPS capsule, Take 1 capsule (0.4 mg total) by mouth daily. (Patient not taking: Reported on 03/19/2024), Disp: 90 capsule, Rfl: 3  "

## 2024-03-20 ENCOUNTER — Telehealth: Payer: Self-pay

## 2024-03-20 ENCOUNTER — Inpatient Hospital Stay: Admitting: Hospice and Palliative Medicine

## 2024-03-20 DIAGNOSIS — C670 Malignant neoplasm of trigone of bladder: Secondary | ICD-10-CM

## 2024-03-20 NOTE — Progress Notes (Signed)
 Multidisciplinary Oncology Council Documentation  Caleb Davies. was presented by our [facility] on 03/20/2024, which included representatives from:  Palliative Care Dietitian  Physical/Occupational Therapist Nurse Navigator Genetics Social work Survivorship RN Financial Navigator Research RN   Caleb Davies currently presents with history of bladder cancer  We reviewed previous medical and familial history, history of present illness, and recent lab results along with all available histopathologic and imaging studies. The MOC considered available treatment options and made the following recommendations/referrals:  Nutrition  The MOC is a meeting of clinicians from various specialty areas who evaluate and discuss patients for whom a multidisciplinary approach is being considered. Final determinations in the plan of care are those of the provider(s).   Today's extended care, comprehensive team conference, Caleb Davies was not present for the discussion and was not examined.

## 2024-03-20 NOTE — Telephone Encounter (Signed)
 Received [facility] request for a different medication because pt had side effects on Tamsulosin  throat closing and voice changing stated from his pharmacy. I called patient to discuss if he would like an appointment with Dr.Garren to discuss a new medication since his main reason for seeing Dr.Garren was for Prostate Cancer not BPH per Dr.Garren. Pt stated he could pee fine and did not need any medication at this time. I told patient if he has any issues with this later down the line he can give us  a call.-Geraldyne Barraclough, CMA.

## 2024-03-26 ENCOUNTER — Inpatient Hospital Stay

## 2024-03-26 VITALS — BP 138/79 | HR 59 | Temp 97.7°F | Resp 16 | Wt 198.6 lb

## 2024-03-26 DIAGNOSIS — Z5111 Encounter for antineoplastic chemotherapy: Secondary | ICD-10-CM | POA: Diagnosis not present

## 2024-03-26 DIAGNOSIS — C671 Malignant neoplasm of dome of bladder: Secondary | ICD-10-CM

## 2024-03-26 LAB — CBC WITH DIFFERENTIAL (CANCER CENTER ONLY)
Abs Immature Granulocytes: 0.02 K/uL (ref 0.00–0.07)
Basophils Absolute: 0 K/uL (ref 0.0–0.1)
Basophils Relative: 1 %
Eosinophils Absolute: 0.4 K/uL (ref 0.0–0.5)
Eosinophils Relative: 8 %
HCT: 36.1 % — ABNORMAL LOW (ref 39.0–52.0)
Hemoglobin: 12.7 g/dL — ABNORMAL LOW (ref 13.0–17.0)
Immature Granulocytes: 0 %
Lymphocytes Relative: 19 %
Lymphs Abs: 1 K/uL (ref 0.7–4.0)
MCH: 31.8 pg (ref 26.0–34.0)
MCHC: 35.2 g/dL (ref 30.0–36.0)
MCV: 90.5 fL (ref 80.0–100.0)
Monocytes Absolute: 0.5 K/uL (ref 0.1–1.0)
Monocytes Relative: 10 %
Neutro Abs: 3.4 K/uL (ref 1.7–7.7)
Neutrophils Relative %: 62 %
Platelet Count: 174 K/uL (ref 150–400)
RBC: 3.99 MIL/uL — ABNORMAL LOW (ref 4.22–5.81)
RDW: 12.7 % (ref 11.5–15.5)
WBC Count: 5.4 K/uL (ref 4.0–10.5)
nRBC: 0 % (ref 0.0–0.2)

## 2024-03-26 LAB — CMP (CANCER CENTER ONLY)
ALT: 11 U/L (ref 0–44)
AST: 16 U/L (ref 15–41)
Albumin: 3.9 g/dL (ref 3.5–5.0)
Alkaline Phosphatase: 86 U/L (ref 38–126)
Anion gap: 10 (ref 5–15)
BUN: 11 mg/dL (ref 8–23)
CO2: 26 mmol/L (ref 22–32)
Calcium: 9.6 mg/dL (ref 8.9–10.3)
Chloride: 106 mmol/L (ref 98–111)
Creatinine: 1.41 mg/dL — ABNORMAL HIGH (ref 0.61–1.24)
GFR, Estimated: 53 mL/min — ABNORMAL LOW
Glucose, Bld: 154 mg/dL — ABNORMAL HIGH (ref 70–99)
Potassium: 4.3 mmol/L (ref 3.5–5.1)
Sodium: 142 mmol/L (ref 135–145)
Total Bilirubin: 1.9 mg/dL — ABNORMAL HIGH (ref 0.0–1.2)
Total Protein: 7 g/dL (ref 6.5–8.1)

## 2024-03-26 MED ORDER — OXYBUTYNIN CHLORIDE 5 MG PO TABS
5.0000 mg | ORAL_TABLET | Freq: Once | ORAL | Status: AC | PRN
  Administered 2024-03-26: 5 mg via ORAL
  Filled 2024-03-26: qty 1

## 2024-03-26 MED ORDER — PROCHLORPERAZINE MALEATE 10 MG PO TABS
10.0000 mg | ORAL_TABLET | Freq: Once | ORAL | Status: AC
  Administered 2024-03-26: 10 mg via ORAL
  Filled 2024-03-26: qty 1

## 2024-03-26 MED ORDER — GEMCITABINE CHEMO FOR BLADDER INSTILLATION 2000 MG
2000.0000 mg | Freq: Once | INTRAVENOUS | Status: AC
  Administered 2024-03-26: 2000 mg via INTRAVESICAL
  Filled 2024-03-26: qty 52.6

## 2024-03-26 MED ORDER — LIDOCAINE HCL URETHRAL/MUCOSAL 2 % EX GEL
1.0000 | Freq: Once | CUTANEOUS | Status: AC
  Administered 2024-03-26: 1 via URETHRAL
  Filled 2024-03-26: qty 1

## 2024-03-26 NOTE — Progress Notes (Signed)
 Foley clamped at 1320. Foley unclamped and drained at 1415 due to patient discomfort.

## 2024-03-26 NOTE — Patient Instructions (Addendum)
 Drink plenty of fluids.  Do not urinate for 1 to 2 hours post procedure, if possible.  Sit on the toilet to avoid splashing. Completely empty the bladder when urinating.  Use soap and water  to wash the genitals and hands after urinating.  After toilet use, add 2 cups of bleach to the toilet, let it sit for 20 minutes, and flush; repeat this process after each use for up to 6 hours after the procedure. Have others, including caregivers, use the toilet only after it has been cleaned and treated properly.  Launder clothes, bedding, and other fabrics that come in contact with urine and other bodily fluids in hot soapy water .  Gemcitabine  Injection What is this medication? GEMCITABINE  (jem SYE ta been) treats some types of cancer. It works by slowing down the growth of cancer cells. This medicine may be used for other purposes; ask your health care provider or pharmacist if you have questions. COMMON BRAND NAME(S): Gemzar , Infugem  What should I tell my care team before I take this medication? They need to know if you have any of these conditions: Blood disorders Infection Kidney disease Liver disease Lung or breathing disease, such as asthma or COPD Recent or ongoing radiation therapy An unusual or allergic reaction to gemcitabine , other medications, foods, dyes, or preservatives If you or your partner are pregnant or trying to get pregnant Breast-feeding How should I use this medication? This medication is injected into a vein. It is given by your care team in a hospital or clinic setting. Talk to your care team about the use of this medication in children. Special care may be needed. Overdosage: If you think you have taken too much of this medicine contact a poison control center or emergency room at once. NOTE: This medicine is only for you. Do not share this medicine with others. What if I miss a dose? Keep appointments for follow-up doses. It is important not to miss your dose. Call your  care team if you are unable to keep an appointment. What may interact with this medication? Interactions have not been studied. This list may not describe all possible interactions. Give your health care provider a list of all the medicines, herbs, non-prescription drugs, or dietary supplements you use. Also tell them if you smoke, drink alcohol, or use illegal drugs. Some items may interact with your medicine. What should I watch for while using this medication? Your condition will be monitored carefully while you are receiving this medication. This medication may make you feel generally unwell. This is not uncommon, as chemotherapy can affect healthy cells as well as cancer cells. Report any side effects. Continue your course of treatment even though you feel ill unless your care team tells you to stop. In some cases, you may be given additional medications to help with side effects. Follow all directions for their use. This medication may increase your risk of getting an infection. Call your care team for advice if you get a fever, chills, sore throat, or other symptoms of a cold or flu. Do not treat yourself. Try to avoid being around people who are sick. This medication may increase your risk to bruise or bleed. Call your care team if you notice any unusual bleeding. Be careful brushing or flossing your teeth or using a toothpick because you may get an infection or bleed more easily. If you have any dental work done, tell your dentist you are receiving this medication. Avoid taking medications that contain aspirin, acetaminophen , ibuprofen,  naproxen, or ketoprofen unless instructed by your care team. These medications may hide a fever. Talk to your care team if you or your partner wish to become pregnant or think you might be pregnant. This medication can cause serious birth defects if taken during pregnancy and for 6 months after the last dose. A negative pregnancy test is required before starting  this medication. A reliable form of contraception is recommended while taking this medication and for 6 months after the last dose. Talk to your care team about effective forms of contraception. Do not father a child while taking this medication and for 3 months after the last dose. Use a condom while having sex during this time period. Do not breastfeed while taking this medication and for at least 1 week after the last dose. This medication may cause infertility. Talk to your care team if you are concerned about your fertility. What side effects may I notice from receiving this medication? Side effects that you should report to your care team as soon as possible: Allergic reactions--skin rash, itching, hives, swelling of the face, lips, tongue, or throat Capillary leak syndrome--stomach or muscle pain, unusual weakness or fatigue, feeling faint or lightheaded, decrease in the amount of urine, swelling of the ankles, hands, or feet, trouble breathing Infection--fever, chills, cough, sore throat, wounds that don't heal, pain or trouble when passing urine, general feeling of discomfort or being unwell Liver injury--right upper belly pain, loss of appetite, nausea, light-colored stool, dark yellow or brown urine, yellowing skin or eyes, unusual weakness or fatigue Low red blood cell level--unusual weakness or fatigue, dizziness, headache, trouble breathing Lung injury--shortness of breath or trouble breathing, cough, spitting up blood, chest pain, fever Stomach pain, bloody diarrhea, pale skin, unusual weakness or fatigue, decrease in the amount of urine, which may be signs of hemolytic uremic syndrome Sudden and severe headache, confusion, change in vision, seizures, which may be signs of posterior reversible encephalopathy syndrome (PRES) Unusual bruising or bleeding Side effects that usually do not require medical attention (report to your care team if they continue or are  bothersome): Diarrhea Drowsiness Hair loss Nausea Pain, redness, or swelling with sores inside the mouth or throat Vomiting This list may not describe all possible side effects. Call your doctor for medical advice about side effects. You may report side effects to FDA at 1-800-FDA-1088. Where should I keep my medication? This medication is given in a hospital or clinic. It will not be stored at home. NOTE: This sheet is a summary. It may not cover all possible information. If you have questions about this medicine, talk to your doctor, pharmacist, or health care provider.  2024 Elsevier/Gold Standard (2021-07-06 00:00:00)

## 2024-04-02 ENCOUNTER — Inpatient Hospital Stay: Admitting: Oncology

## 2024-04-02 ENCOUNTER — Encounter: Payer: Self-pay | Admitting: Oncology

## 2024-04-02 ENCOUNTER — Inpatient Hospital Stay

## 2024-04-02 ENCOUNTER — Other Ambulatory Visit: Payer: Self-pay | Admitting: Oncology

## 2024-04-02 VITALS — BP 132/80 | HR 65 | Temp 97.0°F | Resp 19 | Ht 70.0 in | Wt 198.1 lb

## 2024-04-02 DIAGNOSIS — C671 Malignant neoplasm of dome of bladder: Secondary | ICD-10-CM

## 2024-04-02 DIAGNOSIS — Z5111 Encounter for antineoplastic chemotherapy: Secondary | ICD-10-CM | POA: Diagnosis not present

## 2024-04-02 LAB — CBC WITH DIFFERENTIAL (CANCER CENTER ONLY)
Abs Immature Granulocytes: 0.01 K/uL (ref 0.00–0.07)
Basophils Absolute: 0 K/uL (ref 0.0–0.1)
Basophils Relative: 1 %
Eosinophils Absolute: 0.6 K/uL — ABNORMAL HIGH (ref 0.0–0.5)
Eosinophils Relative: 10 %
HCT: 37.3 % — ABNORMAL LOW (ref 39.0–52.0)
Hemoglobin: 13.1 g/dL (ref 13.0–17.0)
Immature Granulocytes: 0 %
Lymphocytes Relative: 17 %
Lymphs Abs: 0.9 K/uL (ref 0.7–4.0)
MCH: 31.6 pg (ref 26.0–34.0)
MCHC: 35.1 g/dL (ref 30.0–36.0)
MCV: 90.1 fL (ref 80.0–100.0)
Monocytes Absolute: 0.5 K/uL (ref 0.1–1.0)
Monocytes Relative: 10 %
Neutro Abs: 3.4 K/uL (ref 1.7–7.7)
Neutrophils Relative %: 62 %
Platelet Count: 193 K/uL (ref 150–400)
RBC: 4.14 MIL/uL — ABNORMAL LOW (ref 4.22–5.81)
RDW: 13.1 % (ref 11.5–15.5)
WBC Count: 5.4 K/uL (ref 4.0–10.5)
nRBC: 0 % (ref 0.0–0.2)

## 2024-04-02 LAB — URINALYSIS, COMPLETE (UACMP) WITH MICROSCOPIC
Bacteria, UA: NONE SEEN
Bilirubin Urine: NEGATIVE
Glucose, UA: NEGATIVE mg/dL
Hgb urine dipstick: NEGATIVE
Ketones, ur: NEGATIVE mg/dL
Leukocytes,Ua: NEGATIVE
Nitrite: NEGATIVE
Protein, ur: NEGATIVE mg/dL
RBC / HPF: 0 RBC/hpf (ref 0–5)
Specific Gravity, Urine: 1.005 (ref 1.005–1.030)
pH: 6 (ref 5.0–8.0)

## 2024-04-02 LAB — CMP (CANCER CENTER ONLY)
ALT: 14 U/L (ref 0–44)
AST: 22 U/L (ref 15–41)
Albumin: 4 g/dL (ref 3.5–5.0)
Alkaline Phosphatase: 87 U/L (ref 38–126)
Anion gap: 10 (ref 5–15)
BUN: 10 mg/dL (ref 8–23)
CO2: 26 mmol/L (ref 22–32)
Calcium: 9.5 mg/dL (ref 8.9–10.3)
Chloride: 106 mmol/L (ref 98–111)
Creatinine: 1.34 mg/dL — ABNORMAL HIGH (ref 0.61–1.24)
GFR, Estimated: 56 mL/min — ABNORMAL LOW
Glucose, Bld: 154 mg/dL — ABNORMAL HIGH (ref 70–99)
Potassium: 4.3 mmol/L (ref 3.5–5.1)
Sodium: 142 mmol/L (ref 135–145)
Total Bilirubin: 1.8 mg/dL — ABNORMAL HIGH (ref 0.0–1.2)
Total Protein: 7.1 g/dL (ref 6.5–8.1)

## 2024-04-02 MED ORDER — SODIUM CHLORIDE 0.9 % IV SOLN
INTRAVENOUS | Status: DC
  Filled 2024-04-02: qty 250

## 2024-04-02 MED ORDER — LIDOCAINE HCL URETHRAL/MUCOSAL 2 % EX GEL
1.0000 | Freq: Once | CUTANEOUS | Status: AC
  Administered 2024-04-02: 1 via URETHRAL
  Filled 2024-04-02: qty 1

## 2024-04-02 MED ORDER — GEMCITABINE CHEMO FOR BLADDER INSTILLATION 2000 MG
2000.0000 mg | Freq: Once | INTRAVENOUS | Status: AC
  Administered 2024-04-02: 2000 mg via INTRAVESICAL
  Filled 2024-04-02: qty 52.6

## 2024-04-02 MED ORDER — OXYBUTYNIN CHLORIDE 5 MG PO TABS
5.0000 mg | ORAL_TABLET | Freq: Once | ORAL | Status: AC | PRN
  Administered 2024-04-02: 5 mg via ORAL
  Filled 2024-04-02: qty 1

## 2024-04-02 MED ORDER — PROCHLORPERAZINE MALEATE 10 MG PO TABS
10.0000 mg | ORAL_TABLET | Freq: Once | ORAL | Status: AC
  Administered 2024-04-02: 10 mg via ORAL
  Filled 2024-04-02: qty 1

## 2024-04-02 NOTE — Progress Notes (Signed)
 Nutrition Assessment   Reason for Assessment:   New chemotherapy   ASSESSMENT: 74 year old male with high grade non muscle invasive bladder cancer.  Past medical history of esophageal dilation for food impaction, DM, GERD, HLD, HTN, stroke, TURBT.  Patient receiving intravesical gemcitabine  chemotherapy.   Met with patient during infusion.  Reports that appetite is good/normal.  Usually eats cereal or egg sandwich for breakfast. Lunch salad or sandwich and dinner pizza or spaghetti, vegetables.  Goes out to eat for most meals but does cook some at home.    Walks 30 minutes at [facility] 3 times a week  Medications: reviewed   Labs: glucose 154, creatinine 1.34   Anthropometrics:   Height: 70 inches Weight: 198 lb 10.2 Stable weight BMI: 28   Estimated Energy Needs  Kcals: 1900-2250 Protein: 95-112 g Fluid: 1900 ml   NUTRITION DIAGNOSIS: none at this time    INTERVENTION:  Continue well balanced diet including lean proteins and plant foods Contact information provided    Next Visit: no follow-up Patient to contact RD if needed  Manas Hickling B. Dasie SOLON, CSO, LDN Registered Dietitian [phone]

## 2024-04-02 NOTE — Patient Instructions (Signed)
 [facility] - A DEPT OF [facility]  Discharge Instructions: Thank you for choosing [facility] to provide your oncology and hematology care.  If you have a lab appointment with the Cancer Center, please go directly to the Cancer Center and check in at the registration area.  Wear comfortable clothing and clothing appropriate for easy access to any Portacath or PICC line.   We strive to give you quality time with your provider. You may need to reschedule your appointment if you arrive late (15 or more minutes).  Arriving late affects you and other patients whose appointments are after yours.  Also, if you miss three or more appointments without notifying the office, you may be dismissed from the clinic at the providers discretion.      For prescription refill requests, have your pharmacy contact our office and allow 72 hours for refills to be completed.    Today you received the following chemotherapy and/or immunotherapy agents BLADDER GEMZAR       To help prevent nausea and vomiting after your treatment, we encourage you to take your nausea medication as directed.  BELOW ARE SYMPTOMS THAT SHOULD BE REPORTED IMMEDIATELY: *FEVER GREATER THAN 100.4 F (38 C) OR HIGHER *CHILLS OR SWEATING *NAUSEA AND VOMITING THAT IS NOT CONTROLLED WITH YOUR NAUSEA MEDICATION *UNUSUAL SHORTNESS OF BREATH *UNUSUAL BRUISING OR BLEEDING *URINARY PROBLEMS (pain or burning when urinating, or frequent urination) *BOWEL PROBLEMS (unusual diarrhea, constipation, pain near the anus) TENDERNESS IN MOUTH AND THROAT WITH OR WITHOUT PRESENCE OF ULCERS (sore throat, sores in mouth, or a toothache) UNUSUAL RASH, SWELLING OR PAIN  UNUSUAL VAGINAL DISCHARGE OR ITCHING   Items with * indicate a potential emergency and should be followed up as soon as possible or go to the Emergency Department if any problems should occur.  Please show the CHEMOTHERAPY ALERT CARD or  IMMUNOTHERAPY ALERT CARD at check-in to the Emergency Department and triage nurse.  Should you have questions after your visit or need to cancel or reschedule your appointment, please contact [facility] - A DEPT OF [facility]  [phone] and follow the prompts.  Office hours are 8:00 a.m. to 4:30 p.m. Monday - Friday. Please note that voicemails left after 4:00 p.m. may not be returned until the following business day.  We are closed weekends and major holidays. You have access to a nurse at all times for urgent questions. Please call the main number to the clinic [phone] and follow the prompts.  For any non-urgent questions, you may also contact your provider using MyChart. We now offer e-Visits for anyone 38 and older to request care online for non-urgent symptoms. For details visit [email or web address].   Also download the MyChart app! Go to the app store, search MyChart, open the app, select , and log in with your MyChart username and password.  Gemcitabine  Injection What is this medication? GEMCITABINE  (jem SYE ta been) treats some types of cancer. It works by slowing down the growth of cancer cells. This medicine may be used for other purposes; ask your health care provider or pharmacist if you have questions. COMMON BRAND NAME(S): Gemzar , Infugem  What should I tell my care team before I take this medication? They need to know if you have any of these conditions: Blood disorders Infection Kidney disease Liver disease Lung or breathing disease, such as asthma or COPD Recent or ongoing radiation therapy An unusual or  allergic reaction to gemcitabine , other medications, foods, dyes, or preservatives If you or your partner are pregnant or trying to get pregnant Breast-feeding How should I use this medication? This medication is injected into a vein. It is given by your care team in a hospital or clinic setting. Talk to your care  team about the use of this medication in children. Special care may be needed. Overdosage: If you think you have taken too much of this medicine contact a poison control center or emergency room at once. NOTE: This medicine is only for you. Do not share this medicine with others. What if I miss a dose? Keep appointments for follow-up doses. It is important not to miss your dose. Call your care team if you are unable to keep an appointment. What may interact with this medication? Interactions have not been studied. This list may not describe all possible interactions. Give your health care provider a list of all the medicines, herbs, non-prescription drugs, or dietary supplements you use. Also tell them if you smoke, drink alcohol, or use illegal drugs. Some items may interact with your medicine. What should I watch for while using this medication? Your condition will be monitored carefully while you are receiving this medication. This medication may make you feel generally unwell. This is not uncommon, as chemotherapy can affect healthy cells as well as cancer cells. Report any side effects. Continue your course of treatment even though you feel ill unless your care team tells you to stop. In some cases, you may be given additional medications to help with side effects. Follow all directions for their use. This medication may increase your risk of getting an infection. Call your care team for advice if you get a fever, chills, sore throat, or other symptoms of a cold or flu. Do not treat yourself. Try to avoid being around people who are sick. This medication may increase your risk to bruise or bleed. Call your care team if you notice any unusual bleeding. Be careful brushing or flossing your teeth or using a toothpick because you may get an infection or bleed more easily. If you have any dental work done, tell your dentist you are receiving this medication. Avoid taking medications that contain  aspirin, acetaminophen , ibuprofen, naproxen, or ketoprofen unless instructed by your care team. These medications may hide a fever. Talk to your care team if you or your partner wish to become pregnant or think you might be pregnant. This medication can cause serious birth defects if taken during pregnancy and for 6 months after the last dose. A negative pregnancy test is required before starting this medication. A reliable form of contraception is recommended while taking this medication and for 6 months after the last dose. Talk to your care team about effective forms of contraception. Do not father a child while taking this medication and for 3 months after the last dose. Use a condom while having sex during this time period. Do not breastfeed while taking this medication and for at least 1 week after the last dose. This medication may cause infertility. Talk to your care team if you are concerned about your fertility. What side effects may I notice from receiving this medication? Side effects that you should report to your care team as soon as possible: Allergic reactions--skin rash, itching, hives, swelling of the face, lips, tongue, or throat Capillary leak syndrome--stomach or muscle pain, unusual weakness or fatigue, feeling faint or lightheaded, decrease in the amount of urine, swelling  of the ankles, hands, or feet, trouble breathing Infection--fever, chills, cough, sore throat, wounds that don't heal, pain or trouble when passing urine, general feeling of discomfort or being unwell Liver injury--right upper belly pain, loss of appetite, nausea, light-colored stool, dark yellow or brown urine, yellowing skin or eyes, unusual weakness or fatigue Low red blood cell level--unusual weakness or fatigue, dizziness, headache, trouble breathing Lung injury--shortness of breath or trouble breathing, cough, spitting up blood, chest pain, fever Stomach pain, bloody diarrhea, pale skin, unusual weakness or  fatigue, decrease in the amount of urine, which may be signs of hemolytic uremic syndrome Sudden and severe headache, confusion, change in vision, seizures, which may be signs of posterior reversible encephalopathy syndrome (PRES) Unusual bruising or bleeding Side effects that usually do not require medical attention (report to your care team if they continue or are bothersome): Diarrhea Drowsiness Hair loss Nausea Pain, redness, or swelling with sores inside the mouth or throat Vomiting This list may not describe all possible side effects. Call your doctor for medical advice about side effects. You may report side effects to FDA at 1-800-FDA-1088. Where should I keep my medication? This medication is given in a hospital or clinic. It will not be stored at home. NOTE: This sheet is a summary. It may not cover all possible information. If you have questions about this medicine, talk to your doctor, pharmacist, or health care provider.  2024 Elsevier/Gold Standard (2021-07-06 00:00:00)

## 2024-04-02 NOTE — Progress Notes (Signed)
 "    Hematology/Oncology Consult note [facility]  [phone] [phone]  Patient Care Team: Amadeo Rush, MD as PCP - General (Internal Medicine) Melanee Annah BROCKS, MD as Consulting Physician (Oncology)   Name of the patient: Caleb Davies  [id number]  Nov 27, 1950   Date of visit: 04/02/24  Diagnosis-  Cancer Staging  Bladder cancer [facility]) Staging form: Urinary Bladder, AJCC 8th Edition - Clinical stage from 02/23/2024: Stage I (cT1, cN0, cM0) - Signed by Melanee Annah BROCKS, MD on 02/23/2024 WHO/ISUP grade (low/high): High Grade Histologic grading system: 2 grade system    Chief complaint/ Reason for visit-on treatment assessment prior to cycle 3 of weekly intravesical gemcitabine  chemotherapy  Heme/Onc history: Caleb Davies is a 74 year old male with recently diagnosed high-grade non-muscle invasive bladder cancer presenting for oncology consultation regarding planned intravesical gemcitabine  therapy.   He initially presented in September 2025 with gross hematuria, which prompted an emergency department evaluation in September 2025. Renal ultrasound demonstrated showed eccentric left soft tissue echogenicity within the bladder highly concerning for urothelial carcinoma.    CT imaging was performed as part of the hematuria workup.  Which showed mild left bladder wall thickening.  No evidence of abdominopelvic metastatic disease.  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     Patient underwent transurethral resection of bladder tumor (TURBT) by Dr. Georganne  On 01/02/2024 which showed large papillary tumor with squamous/necrotic calcifications over the left trigone and left ureteral orifice.  He had a left ureteral stent placed at that time.  2 g intravesical gemcitabine  was instilled postoperatively.  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   Muscularis propria was present and not involved.  Patient underwent a second TURBT on 02/06/2024 which was negative for malignancy.   He currently denies hematuria and has no urinary symptoms, including dysuria, frequency, or urgency. There are no other genitourinary complaints at this time.    Interval history- Caleb Davies is a 74 year old male with non-muscle invasive bladder cancer undergoing intravesical gemcitabine  therapy who presents for his third scheduled treatment.  He is currently receiving a six-week course of intravesical gemcitabine  for superficial bladder cancer, with today marking the third of six planned treatments. He reports significant discomfort during Foley catheter insertion, noting that adequate lubrication and proper technique during the initial session minimized pain, whereas the most recent session was painful due to insufficient lubrication despite local anesthesia, resulting in increased friction and pain.  During the last treatment, he experienced marked urinary urgency, a sensation of wetness, pain, and involuntary urination outside the catheter. He delayed completion of the treatment by approximately five minutes following this episode. He has not experienced nausea with treatment.  Urine is monitored biweekly, with the most recent sample on March 19, 2024, reported as normal. He denies new urinary symptoms, including hematuria, dysuria, or fever since the last evaluation.  He recalls his urologist previously discussed a regimen involving three weekly sessions, a three-week break, followed by three additional weekly sessions and possible maintenance therapy every three months. He expresses some uncertainty regarding the long-term schedule.       ECOG PS- 1 Pain scale- 0   Review of systems- Review of Systems  Constitutional:  Negative for chills, fever, malaise/fatigue and weight loss.  HENT:  Negative for congestion, ear discharge and  nosebleeds.   Eyes:  Negative for blurred vision.  Respiratory:  Negative for  cough, hemoptysis, sputum production, shortness of breath and wheezing.   Cardiovascular:  Negative for chest pain, palpitations, orthopnea and claudication.  Gastrointestinal:  Negative for abdominal pain, blood in stool, constipation, diarrhea, heartburn, melena, nausea and vomiting.  Genitourinary:  Negative for dysuria, flank pain, frequency, hematuria and urgency.  Musculoskeletal:  Negative for back pain, joint pain and myalgias.  Skin:  Negative for rash.  Neurological:  Negative for dizziness, tingling, focal weakness, seizures, weakness and headaches.  Endo/Heme/Allergies:  Does not bruise/bleed easily.  Psychiatric/Behavioral:  Negative for depression and suicidal ideas. The patient does not have insomnia.       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   Diabetes mellitus without complication [facility])    Erectile dysfunction 2014   GERD (gastroesophageal reflux disease)    Hyperlipidemia 2012   Hypertension    Otorrhagia of right ear 2019   Polyp of ascending colon    Polyp of transverse colon    Problems with swallowing and mastication    Stricture and stenosis of esophagus    Stroke (HCC) 2009     Past Surgical History:  Procedure Laterality Date   ANTERIOR CERVICAL DECOMP/DISCECTOMY FUSION  2014   C4-7   BLADDER INSTILLATION N/A 01/02/2024   Procedure: INSTILLATION, BLADDER;  Surgeon: Georganne Penne SAUNDERS, MD;  Location: [facility] ORS;  Service: Urology;  Laterality: N/A;  Gemcitabine    COLONOSCOPY N/A 09/2013   repeat due 09/2018   COLONOSCOPY N/A 12/19/2023   Procedure: COLONOSCOPY;  Surgeon: Jinny Carmine, MD;  Location: [facility] ENDOSCOPY;  Service: Endoscopy;  Laterality: N/A;   COLONOSCOPY WITH PROPOFOL  N/A 10/29/2018   Procedure: COLONOSCOPY WITH BIOPSY;  Surgeon:  Jinny Carmine, MD;  Location: [facility];  Service: Endoscopy;  Laterality: N/A;  DIABETIC doesnt want arrival time earlier than 0730   CYSTOSCOPY W/ URETERAL STENT PLACEMENT Left 01/02/2024   Procedure: CYSTOSCOPY, WITH RETROGRADE PYELOGRAM AND URETERAL STENT INSERTION;  Surgeon: Georganne Penne SAUNDERS, MD;  Location: [facility] ORS;  Service: Urology;  Laterality: Left;   CYSTOSCOPY W/ URETERAL STENT REMOVAL Left 02/06/2024   Procedure: REMOVAL, STENT, URETER, CYSTOSCOPIC;  Surgeon: Georganne Penne SAUNDERS, MD;  Location: [facility] ORS;  Service: Urology;  Laterality: Left;   CYSTOSCOPY WITH RETROGRADE PYELOGRAM, URETEROSCOPY AND STENT PLACEMENT Left 02/06/2024   Procedure: CYSTOURETEROSCOPY, WITH RETROGRADE PYELOGRAM;  Surgeon: Georganne Penne SAUNDERS, MD;  Location: [facility] ORS;  Service: Urology;  Laterality: Left;   ESOPHAGEAL DILATION N/A 10/29/2018   Procedure: ESOPHAGEAL DILATION;  Surgeon: Jinny Carmine, MD;  Location: [facility];  Service: Endoscopy;  Laterality: N/A;   ESOPHAGOGASTRODUODENOSCOPY N/A 09/2013   ESOPHAGOGASTRODUODENOSCOPY (EGD) WITH PROPOFOL  N/A 10/29/2018   Procedure: ESOPHAGOGASTRODUODENOSCOPY (EGD) WITH DILATION;  Surgeon: Jinny Carmine, MD;  Location: [facility];  Service: Endoscopy;  Laterality: N/A;   POLYPECTOMY N/A 10/29/2018   Procedure: POLYPECTOMY;  Surgeon: Jinny Carmine, MD;  Location: [facility]ERY [facility];  Service: Endoscopy;  Laterality: N/A;   POLYPECTOMY  12/19/2023   Procedure: POLYPECTOMY, INTESTINE;  Surgeon: Jinny Carmine, MD;  Location: [facility] ENDOSCOPY;  Service: Endoscopy;;   TRANSURETHRAL RESECTION OF BLADDER TUMOR N/A 01/02/2024   Procedure: TURBT (TRANSURETHRAL RESECTION OF BLADDER TUMOR);  Surgeon: Georganne Penne SAUNDERS, MD;  Location: [facility] ORS;  Service: Urology;  Laterality: N/A;   TRANSURETHRAL RESECTION OF BLADDER TUMOR N/A 02/06/2024   Procedure: TURBT (TRANSURETHRAL RESECTION OF BLADDER TUMOR);  Surgeon: Georganne,  Penne SAUNDERS, MD;  Location: [facility] ORS;   Service: Urology;  Laterality: N/A;    Social History   Socioeconomic History   Marital status: Divorced    Spouse name: Not on file   Number of children: Not on file   Years of education: Not on file   Highest education level: Not on file  Occupational History   Not on file  Tobacco Use   Smoking status: Former    Current packs/day: 0.00    Average packs/day: 1 pack/day for 40.0 years (40.0 ttl pk-yrs)    Types: Cigarettes    Start date: 39    Quit date: 2009    Years since quitting: 17.0   Smokeless tobacco: Former    Types: Chew    Quit date: 02/15/2024  Vaping Use   Vaping status: Never Used  Substance and Sexual Activity   Alcohol use: Not Currently    Comment: rarely   Drug use: Never   Sexual activity: Not Currently    Birth control/protection: None  Other Topics Concern   Not on file  Social History Narrative   Not on file   Social Drivers of Health   Tobacco Use: Medium Risk (04/02/2024)   Patient History    Smoking Tobacco Use: Former    Smokeless Tobacco Use: Former    Passive Exposure: Not on Actuary Strain: Low Risk (05/18/2023)   Received from [facility]   Overall Financial Resource Strain (CARDIA)    Difficulty of Paying Living Expenses: Not hard at all  Food Insecurity: No Food Insecurity (02/23/2024)   Epic    Worried About Radiation Protection Practitioner of Food in the Last Year: Never true    Ran Out of Food in the Last Year: Never true  Transportation Needs: No Transportation Needs (02/23/2024)   Epic    Lack of Transportation (Medical): No    Lack of Transportation (Non-Medical): No  Physical Activity: Insufficiently Active (01/17/2024)   Received from [facility]   Exercise Vital Sign    On average, how many days per week do you engage in moderate to strenuous exercise (like a brisk walk)?: 3 days    On average, how many minutes do you engage in exercise at this level?: 30 min  Stress: No Stress Concern Present (01/17/2024)    Received from [facility] of Occupational Health - Occupational Stress Questionnaire    Do you feel stress - tense, restless, nervous, or anxious, or unable to sleep at night because your mind is troubled all the time - these days?: Not at all  Social Connections: Not on file  Intimate Partner Violence: Not At Risk (02/23/2024)   Epic    Fear of Current or Ex-Partner: No    Emotionally Abused: No    Physically Abused: No    Sexually Abused: No  Depression (PHQ2-9): Low Risk (04/02/2024)   Depression (PHQ2-9)    PHQ-2 Score: 0  Alcohol Screen: Not on file  Housing: Low Risk (02/23/2024)   Epic    Unable to Pay for Housing in the Last Year: No    Number of Times Moved in the Last Year: 0    Homeless in the Last Year: No  Utilities: Not At Risk (02/23/2024)   Epic    Threatened with loss of utilities: No  Health Literacy: Medium Risk (05/18/2023)   Received from [facility] Literacy    How often do you  need to have someone help you when you read instructions, pamphlets, or other written material from your doctor or pharmacy?: Rarely    Family History  Problem Relation Age of Onset   Cancer Father     Current Medications[2]  Physical exam:  Vitals:   04/02/24 0838  BP: 132/80  Pulse: 65  Resp: 19  Temp: (!) 97 F (36.1 C)  TempSrc: Tympanic  SpO2: 99%  Weight: 198 lb 1.6 oz (89.9 kg)  Height: 5' 10 (1.778 m)   Physical Exam Cardiovascular:     Rate and Rhythm: Normal rate and regular rhythm.     Heart sounds: Normal heart sounds.  Pulmonary:     Effort: Pulmonary effort is normal.     Breath sounds: Normal breath sounds.  Skin:    General: Skin is warm and dry.  Neurological:     Mental Status: He is alert and oriented to person, place, and time.      I have personally reviewed labs listed below:    Latest Ref Rng & Units 04/02/2024    8:16 AM  CMP  Glucose 70 - 99 mg/dL 845   BUN 8 - 23 mg/dL 10   Creatinine 9.38 -  1.24 mg/dL 8.65   Sodium 864 - 854 mmol/L 142   Potassium 3.5 - 5.1 mmol/L 4.3   Chloride 98 - 111 mmol/L 106   CO2 22 - 32 mmol/L 26   Calcium 8.9 - 10.3 mg/dL 9.5   Total Protein 6.5 - 8.1 g/dL 7.1   Total Bilirubin 0.0 - 1.2 mg/dL 1.8   Alkaline Phos 38 - 126 U/L 87   AST 15 - 41 U/L 22   ALT 0 - 44 U/L 14       Latest Ref Rng & Units 04/02/2024    8:16 AM  CBC  WBC 4.0 - 10.5 K/uL 5.4   Hemoglobin 13.0 - 17.0 g/dL 86.8   Hematocrit 60.9 - 52.0 % 37.3   Platelets 150 - 400 K/uL 193      Assessment and plan- Patient is a 74 y.o. male with history of superficial bladder cancer involving the dome of the bladder here for on treatment assessment prior to cycle 3 of weekly intravesical gemcitabine  chemotherapy  Assessment and Plan    Malignant neoplasm of dome of bladder Undergoing intravesical gemcitabine  for superficial bladder cancer, currently on third of six treatments. Discomfort with Foley catheter insertion mitigated by lubrication. Intravesical therapy chosen for efficacy in superficial disease and lower systemic side effects. Urine monitoring unremarkable. Long-term management includes post-treatment urology evaluation and possible cystoscopy. - Administered third intravesical gemcitabine  treatment. - Instructed nursing staff to ensure proper lubrication of Foley catheter to minimize discomfort. - Instructed nursing staff to collect urine sample for urinalysis. - Scheduled next follow-up visit for treatment number five. - Planned completion of six treatments, followed by urology evaluation and possible cystoscopy after a waiting period.         Visit Diagnosis 1. 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 Annah Skene, MD, MPH [facility] [phone] 04/02/2024 8:58 AM                   [1]  Allergies Allergen Reactions   Trazodone Swelling   Amlodipine     Edema   Hydrochlorothiazide Other (See  Comments)   Metformin     Abdominal pain   Tamsulosin      angioedema  Codeine Nausea Only  [2]  Current Outpatient Medications:    aspirin 81 MG chewable tablet, Chew by mouth daily., Disp: , Rfl:    atorvastatin (LIPITOR) 40 MG tablet, Take 40 mg by mouth daily., Disp: , Rfl:    glucose blood (CONTOUR NEXT TEST) test strip, by Other route., Disp: , Rfl:    metoprolol succinate (TOPROL-XL) 25 MG 24 hr tablet, Take 25 mg by mouth daily., Disp: , Rfl:    olmesartan (BENICAR) 40 MG tablet, Take 40 mg by mouth daily., Disp: , Rfl:    pantoprazole (PROTONIX) 40 MG tablet, TAKE 1 TABLET BY MOUTH EVERY DAY, Disp: , Rfl:    prochlorperazine  (COMPAZINE ) 10 MG tablet, Take 1 tablet (10 mg total) by mouth every 6 (six) hours as needed for nausea or vomiting., Disp: 30 tablet, Rfl: 1   tamsulosin  (FLOMAX ) 0.4 MG CAPS capsule, Take 1 capsule (0.4 mg total) by mouth daily., Disp: 90 capsule, Rfl: 3  "

## 2024-04-09 ENCOUNTER — Inpatient Hospital Stay

## 2024-04-09 VITALS — BP 156/67 | HR 65 | Temp 97.0°F | Resp 16

## 2024-04-09 DIAGNOSIS — C671 Malignant neoplasm of dome of bladder: Secondary | ICD-10-CM

## 2024-04-09 DIAGNOSIS — Z5111 Encounter for antineoplastic chemotherapy: Secondary | ICD-10-CM | POA: Diagnosis not present

## 2024-04-09 LAB — CMP (CANCER CENTER ONLY)
ALT: 15 U/L (ref 0–44)
AST: 15 U/L (ref 15–41)
Albumin: 4 g/dL (ref 3.5–5.0)
Alkaline Phosphatase: 83 U/L (ref 38–126)
Anion gap: 12 (ref 5–15)
BUN: 18 mg/dL (ref 8–23)
CO2: 24 mmol/L (ref 22–32)
Calcium: 9.5 mg/dL (ref 8.9–10.3)
Chloride: 106 mmol/L (ref 98–111)
Creatinine: 1.38 mg/dL — ABNORMAL HIGH (ref 0.61–1.24)
GFR, Estimated: 54 mL/min — ABNORMAL LOW
Glucose, Bld: 236 mg/dL — ABNORMAL HIGH (ref 70–99)
Potassium: 3.5 mmol/L (ref 3.5–5.1)
Sodium: 142 mmol/L (ref 135–145)
Total Bilirubin: 1.2 mg/dL (ref 0.0–1.2)
Total Protein: 7 g/dL (ref 6.5–8.1)

## 2024-04-09 LAB — CBC WITH DIFFERENTIAL (CANCER CENTER ONLY)
Abs Immature Granulocytes: 0.06 10*3/uL (ref 0.00–0.07)
Basophils Absolute: 0 10*3/uL (ref 0.0–0.1)
Basophils Relative: 0 %
Eosinophils Absolute: 0.1 10*3/uL (ref 0.0–0.5)
Eosinophils Relative: 2 %
HCT: 35.9 % — ABNORMAL LOW (ref 39.0–52.0)
Hemoglobin: 12.6 g/dL — ABNORMAL LOW (ref 13.0–17.0)
Immature Granulocytes: 1 %
Lymphocytes Relative: 14 %
Lymphs Abs: 1 10*3/uL (ref 0.7–4.0)
MCH: 32.1 pg (ref 26.0–34.0)
MCHC: 35.1 g/dL (ref 30.0–36.0)
MCV: 91.3 fL (ref 80.0–100.0)
Monocytes Absolute: 0.7 10*3/uL (ref 0.1–1.0)
Monocytes Relative: 9 %
Neutro Abs: 5.5 10*3/uL (ref 1.7–7.7)
Neutrophils Relative %: 74 %
Platelet Count: 219 10*3/uL (ref 150–400)
RBC: 3.93 MIL/uL — ABNORMAL LOW (ref 4.22–5.81)
RDW: 14.2 % (ref 11.5–15.5)
WBC Count: 7.4 10*3/uL (ref 4.0–10.5)
nRBC: 0 % (ref 0.0–0.2)

## 2024-04-09 LAB — URINALYSIS, COMPLETE (UACMP) WITH MICROSCOPIC
Bilirubin Urine: NEGATIVE
Glucose, UA: 50 mg/dL — AB
Hgb urine dipstick: NEGATIVE
Ketones, ur: 5 mg/dL — AB
Leukocytes,Ua: NEGATIVE
Nitrite: NEGATIVE
Protein, ur: NEGATIVE mg/dL
Specific Gravity, Urine: 1.026 (ref 1.005–1.030)
Squamous Epithelial / HPF: 0 /HPF (ref 0–5)
pH: 5 (ref 5.0–8.0)

## 2024-04-09 MED ORDER — PROCHLORPERAZINE MALEATE 10 MG PO TABS
10.0000 mg | ORAL_TABLET | Freq: Once | ORAL | Status: AC
  Administered 2024-04-09: 10 mg via ORAL
  Filled 2024-04-09: qty 1

## 2024-04-09 MED ORDER — OXYBUTYNIN CHLORIDE 5 MG PO TABS
10.0000 mg | ORAL_TABLET | Freq: Once | ORAL | Status: AC | PRN
  Administered 2024-04-09: 10 mg via ORAL
  Filled 2024-04-09: qty 2

## 2024-04-09 MED ORDER — GEMCITABINE CHEMO FOR BLADDER INSTILLATION 2000 MG
2000.0000 mg | Freq: Once | INTRAVENOUS | Status: AC
  Administered 2024-04-09: 2000 mg via INTRAVESICAL
  Filled 2024-04-09: qty 52.6

## 2024-04-09 NOTE — Patient Instructions (Signed)
 [facility] - A DEPT OF [facility]  Discharge Instructions: Thank you for choosing [facility] to provide your oncology and hematology care.  If you have a lab appointment with the Cancer Center, please go directly to the Cancer Center and check in at the registration area.  Wear comfortable clothing and clothing appropriate for easy access to any Portacath or PICC line.   We strive to give you quality time with your provider. You may need to reschedule your appointment if you arrive late (15 or more minutes).  Arriving late affects you and other patients whose appointments are after yours.  Also, if you miss three or more appointments without notifying the office, you may be dismissed from the clinic at the providers discretion.      For prescription refill requests, have your pharmacy contact our office and allow 72 hours for refills to be completed.    Today you received the following chemotherapy and/or immunotherapy agents BLADDER GEMZAR       To help prevent nausea and vomiting after your treatment, we encourage you to take your nausea medication as directed.  BELOW ARE SYMPTOMS THAT SHOULD BE REPORTED IMMEDIATELY: *FEVER GREATER THAN 100.4 F (38 C) OR HIGHER *CHILLS OR SWEATING *NAUSEA AND VOMITING THAT IS NOT CONTROLLED WITH YOUR NAUSEA MEDICATION *UNUSUAL SHORTNESS OF BREATH *UNUSUAL BRUISING OR BLEEDING *URINARY PROBLEMS (pain or burning when urinating, or frequent urination) *BOWEL PROBLEMS (unusual diarrhea, constipation, pain near the anus) TENDERNESS IN MOUTH AND THROAT WITH OR WITHOUT PRESENCE OF ULCERS (sore throat, sores in mouth, or a toothache) UNUSUAL RASH, SWELLING OR PAIN  UNUSUAL VAGINAL DISCHARGE OR ITCHING   Items with * indicate a potential emergency and should be followed up as soon as possible or go to the Emergency Department if any problems should occur.  Please show the CHEMOTHERAPY ALERT CARD or  IMMUNOTHERAPY ALERT CARD at check-in to the Emergency Department and triage nurse.  Should you have questions after your visit or need to cancel or reschedule your appointment, please contact [facility] - A DEPT OF [facility]  [phone] and follow the prompts.  Office hours are 8:00 a.m. to 4:30 p.m. Monday - Friday. Please note that voicemails left after 4:00 p.m. may not be returned until the following business day.  We are closed weekends and major holidays. You have access to a nurse at all times for urgent questions. Please call the main number to the clinic [phone] and follow the prompts.  For any non-urgent questions, you may also contact your provider using MyChart. We now offer e-Visits for anyone 68 and older to request care online for non-urgent symptoms. For details visit [email or web address].   Also download the MyChart app! Go to the app store, search MyChart, open the app, select [facility], and log in with your MyChart username and password.  Gemcitabine  Injection What is this medication? GEMCITABINE  (jem SYE ta been) treats some types of cancer. It works by slowing down the growth of cancer cells. This medicine may be used for other purposes; ask your health care provider or pharmacist if you have questions. COMMON BRAND NAME(S): Gemzar , Infugem  What should I tell my care team before I take this medication? They need to know if you have any of these conditions: Blood disorders Infection Kidney disease Liver disease Lung or breathing disease, such as asthma or COPD Recent or ongoing radiation therapy An unusual or  allergic reaction to gemcitabine , other medications, foods, dyes, or preservatives If you or your partner are pregnant or trying to get pregnant Breast-feeding How should I use this medication? This medication is injected into a vein. It is given by your care team in a hospital or clinic setting. Talk to your care  team about the use of this medication in children. Special care may be needed. Overdosage: If you think you have taken too much of this medicine contact a poison control center or emergency room at once. NOTE: This medicine is only for you. Do not share this medicine with others. What if I miss a dose? Keep appointments for follow-up doses. It is important not to miss your dose. Call your care team if you are unable to keep an appointment. What may interact with this medication? Interactions have not been studied. This list may not describe all possible interactions. Give your health care provider a list of all the medicines, herbs, non-prescription drugs, or dietary supplements you use. Also tell them if you smoke, drink alcohol, or use illegal drugs. Some items may interact with your medicine. What should I watch for while using this medication? Your condition will be monitored carefully while you are receiving this medication. This medication may make you feel generally unwell. This is not uncommon, as chemotherapy can affect healthy cells as well as cancer cells. Report any side effects. Continue your course of treatment even though you feel ill unless your care team tells you to stop. In some cases, you may be given additional medications to help with side effects. Follow all directions for their use. This medication may increase your risk of getting an infection. Call your care team for advice if you get a fever, chills, sore throat, or other symptoms of a cold or flu. Do not treat yourself. Try to avoid being around people who are sick. This medication may increase your risk to bruise or bleed. Call your care team if you notice any unusual bleeding. Be careful brushing or flossing your teeth or using a toothpick because you may get an infection or bleed more easily. If you have any dental work done, tell your dentist you are receiving this medication. Avoid taking medications that contain  aspirin, acetaminophen , ibuprofen, naproxen, or ketoprofen unless instructed by your care team. These medications may hide a fever. Talk to your care team if you or your partner wish to become pregnant or think you might be pregnant. This medication can cause serious birth defects if taken during pregnancy and for 6 months after the last dose. A negative pregnancy test is required before starting this medication. A reliable form of contraception is recommended while taking this medication and for 6 months after the last dose. Talk to your care team about effective forms of contraception. Do not father a child while taking this medication and for 3 months after the last dose. Use a condom while having sex during this time period. Do not breastfeed while taking this medication and for at least 1 week after the last dose. This medication may cause infertility. Talk to your care team if you are concerned about your fertility. What side effects may I notice from receiving this medication? Side effects that you should report to your care team as soon as possible: Allergic reactions--skin rash, itching, hives, swelling of the face, lips, tongue, or throat Capillary leak syndrome--stomach or muscle pain, unusual weakness or fatigue, feeling faint or lightheaded, decrease in the amount of urine, swelling  of the ankles, hands, or feet, trouble breathing Infection--fever, chills, cough, sore throat, wounds that don't heal, pain or trouble when passing urine, general feeling of discomfort or being unwell Liver injury--right upper belly pain, loss of appetite, nausea, light-colored stool, dark yellow or brown urine, yellowing skin or eyes, unusual weakness or fatigue Low red blood cell level--unusual weakness or fatigue, dizziness, headache, trouble breathing Lung injury--shortness of breath or trouble breathing, cough, spitting up blood, chest pain, fever Stomach pain, bloody diarrhea, pale skin, unusual weakness or  fatigue, decrease in the amount of urine, which may be signs of hemolytic uremic syndrome Sudden and severe headache, confusion, change in vision, seizures, which may be signs of posterior reversible encephalopathy syndrome (PRES) Unusual bruising or bleeding Side effects that usually do not require medical attention (report to your care team if they continue or are bothersome): Diarrhea Drowsiness Hair loss Nausea Pain, redness, or swelling with sores inside the mouth or throat Vomiting This list may not describe all possible side effects. Call your doctor for medical advice about side effects. You may report side effects to FDA at 1-800-FDA-1088. Where should I keep my medication? This medication is given in a hospital or clinic. It will not be stored at home. NOTE: This sheet is a summary. It may not cover all possible information. If you have questions about this medicine, talk to your doctor, pharmacist, or health care provider.  2024 Elsevier/Gold Standard (2021-07-06 00:00:00)

## 2024-04-14 ENCOUNTER — Telehealth: Payer: Self-pay | Admitting: Oncology

## 2024-04-14 NOTE — Telephone Encounter (Signed)
 Called pt and informed him of our delay in opening on Tuesday 2/3 so we changed his appt to start at 1115. Pt okay with this change and confirmed he would be there

## 2024-04-16 ENCOUNTER — Inpatient Hospital Stay

## 2024-04-16 ENCOUNTER — Inpatient Hospital Stay: Admitting: Oncology

## 2024-04-16 ENCOUNTER — Inpatient Hospital Stay: Attending: Oncology

## 2024-04-16 VITALS — BP 135/62 | HR 63

## 2024-04-16 VITALS — BP 139/81 | HR 65 | Temp 97.8°F | Resp 19 | Ht 70.0 in | Wt 203.4 lb

## 2024-04-16 DIAGNOSIS — Z5111 Encounter for antineoplastic chemotherapy: Secondary | ICD-10-CM

## 2024-04-16 DIAGNOSIS — C671 Malignant neoplasm of dome of bladder: Secondary | ICD-10-CM

## 2024-04-16 LAB — CMP (CANCER CENTER ONLY)
ALT: 20 U/L (ref 0–44)
AST: 16 U/L (ref 15–41)
Albumin: 3.9 g/dL (ref 3.5–5.0)
Alkaline Phosphatase: 75 U/L (ref 38–126)
Anion gap: 10 (ref 5–15)
BUN: 23 mg/dL (ref 8–23)
CO2: 25 mmol/L (ref 22–32)
Calcium: 9.7 mg/dL (ref 8.9–10.3)
Chloride: 102 mmol/L (ref 98–111)
Creatinine: 1.35 mg/dL — ABNORMAL HIGH (ref 0.61–1.24)
GFR, Estimated: 56 mL/min — ABNORMAL LOW
Glucose, Bld: 252 mg/dL — ABNORMAL HIGH (ref 70–99)
Potassium: 4.2 mmol/L (ref 3.5–5.1)
Sodium: 138 mmol/L (ref 135–145)
Total Bilirubin: 1.3 mg/dL — ABNORMAL HIGH (ref 0.0–1.2)
Total Protein: 6.8 g/dL (ref 6.5–8.1)

## 2024-04-16 LAB — CBC WITH DIFFERENTIAL (CANCER CENTER ONLY)
Abs Immature Granulocytes: 0.12 10*3/uL — ABNORMAL HIGH (ref 0.00–0.07)
Basophils Absolute: 0 10*3/uL (ref 0.0–0.1)
Basophils Relative: 0 %
Eosinophils Absolute: 0.1 10*3/uL (ref 0.0–0.5)
Eosinophils Relative: 1 %
HCT: 38.1 % — ABNORMAL LOW (ref 39.0–52.0)
Hemoglobin: 13.2 g/dL (ref 13.0–17.0)
Immature Granulocytes: 1 %
Lymphocytes Relative: 8 %
Lymphs Abs: 0.7 10*3/uL (ref 0.7–4.0)
MCH: 31.9 pg (ref 26.0–34.0)
MCHC: 34.6 g/dL (ref 30.0–36.0)
MCV: 92 fL (ref 80.0–100.0)
Monocytes Absolute: 0.7 10*3/uL (ref 0.1–1.0)
Monocytes Relative: 7 %
Neutro Abs: 8 10*3/uL — ABNORMAL HIGH (ref 1.7–7.7)
Neutrophils Relative %: 83 %
Platelet Count: 209 10*3/uL (ref 150–400)
RBC: 4.14 MIL/uL — ABNORMAL LOW (ref 4.22–5.81)
RDW: 14.7 % (ref 11.5–15.5)
WBC Count: 9.6 10*3/uL (ref 4.0–10.5)
nRBC: 0 % (ref 0.0–0.2)

## 2024-04-16 LAB — URINALYSIS, COMPLETE (UACMP) WITH MICROSCOPIC
Bacteria, UA: NONE SEEN
Bilirubin Urine: NEGATIVE
Glucose, UA: 50 mg/dL — AB
Hgb urine dipstick: NEGATIVE
Ketones, ur: NEGATIVE mg/dL
Leukocytes,Ua: NEGATIVE
Nitrite: NEGATIVE
Protein, ur: NEGATIVE mg/dL
RBC / HPF: 0 RBC/hpf (ref 0–5)
Specific Gravity, Urine: 1.014 (ref 1.005–1.030)
pH: 5 (ref 5.0–8.0)

## 2024-04-16 MED ORDER — OXYBUTYNIN CHLORIDE 5 MG PO TABS
10.0000 mg | ORAL_TABLET | Freq: Once | ORAL | Status: AC | PRN
  Administered 2024-04-16: 10 mg via ORAL
  Filled 2024-04-16: qty 2

## 2024-04-16 MED ORDER — LIDOCAINE HCL URETHRAL/MUCOSAL 2 % EX GEL
1.0000 | Freq: Once | CUTANEOUS | Status: AC
  Administered 2024-04-16: 1 via URETHRAL
  Filled 2024-04-16: qty 1

## 2024-04-16 MED ORDER — PROCHLORPERAZINE MALEATE 10 MG PO TABS
10.0000 mg | ORAL_TABLET | Freq: Once | ORAL | Status: AC
  Administered 2024-04-16: 10 mg via ORAL

## 2024-04-16 MED ORDER — GEMCITABINE CHEMO FOR BLADDER INSTILLATION 2000 MG
2000.0000 mg | Freq: Once | INTRAVENOUS | Status: AC
  Administered 2024-04-16: 2000 mg via INTRAVESICAL
  Filled 2024-04-16: qty 52.6

## 2024-04-16 NOTE — Progress Notes (Signed)
 Patient doing good; no new or acute concerns at this time.

## 2024-04-16 NOTE — Progress Notes (Signed)
 "    Hematology/Oncology Consult note [facility]  [phone] [phone]  Patient Care Team: Amadeo Rush, MD as PCP - General (Internal Medicine) Melanee Annah BROCKS, MD as Consulting Physician (Oncology)   Name of the patient: Caleb Davies  [id number]  06-Jul-1950   Date of visit: 04/16/24  Diagnosis-  Cancer Staging  Bladder cancer [facility]) Staging form: Urinary Bladder, AJCC 8th Edition - Clinical stage from 02/23/2024: Stage I (cT1, cN0, cM0) - Signed by Melanee Annah BROCKS, MD on 02/23/2024 WHO/ISUP grade (low/high): High Grade Histologic grading system: 2 grade system    Chief complaint/ Reason for visit-on treatment assessment for cycle 5 of weekly intravesical gemcitabine  chemotherapy  Heme/Onc history:  Toribio JONETTA Solo Mickey. Alm is a 74 year old male with recently diagnosed high-grade non-muscle invasive bladder cancer presenting for oncology consultation regarding planned intravesical gemcitabine  therapy.   He initially presented in September 2025 with gross hematuria, which prompted an emergency department evaluation in September 2025. Renal ultrasound demonstrated showed eccentric left soft tissue echogenicity within the bladder highly concerning for urothelial carcinoma.    CT imaging was performed as part of the hematuria workup.  Which showed mild left bladder wall thickening.  No evidence of abdominopelvic metastatic disease.  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     Patient underwent transurethral resection of bladder tumor (TURBT) by Dr. Georganne  On 01/02/2024 which showed large papillary tumor with squamous/necrotic calcifications over the left trigone and left ureteral orifice.  He had a left ureteral stent placed at that time.  2 g intravesical gemcitabine  was instilled postoperatively.  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   Muscularis propria was present and not involved.  Patient underwent a second TURBT on 02/06/2024 which was negative for malignancy.   He currently denies hematuria and has no urinary symptoms, including dysuria, frequency, or urgency. There are no other genitourinary complaints at this time.    Interval history- Occasional bladder spasms during treatment but otherwise tolerating it well. No new complaints today History of Present Illness  ECOG PS- 1 Pain scale- 0   Review of systems- Review of Systems  Constitutional:  Negative for chills, fever, malaise/fatigue and weight loss.  HENT:  Negative for congestion, ear discharge and nosebleeds.   Eyes:  Negative for blurred vision.  Respiratory:  Negative for cough, hemoptysis, sputum production, shortness of breath and wheezing.   Cardiovascular:  Negative for chest pain, palpitations, orthopnea and claudication.  Gastrointestinal:  Negative for abdominal pain, blood in stool, constipation, diarrhea, heartburn, melena, nausea and vomiting.  Genitourinary:  Negative for dysuria, flank pain, frequency, hematuria and urgency.  Musculoskeletal:  Negative for back pain, joint pain and myalgias.  Skin:  Negative for rash.  Neurological:  Negative for dizziness, tingling, focal weakness, seizures, weakness and headaches.  Endo/Heme/Allergies:  Does not bruise/bleed easily.  Psychiatric/Behavioral:  Negative for depression and suicidal ideas. The patient does not have insomnia.       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   Diabetes mellitus without complication [facility])    Erectile dysfunction 2014   GERD (gastroesophageal reflux disease)    Hyperlipidemia 2012   Hypertension    Otorrhagia of right ear 2019   Polyp of ascending colon    Polyp of transverse colon  Problems with swallowing and mastication     Stricture and stenosis of esophagus    Stroke [facility]) 2009     Past Surgical History:  Procedure Laterality Date   ANTERIOR CERVICAL DECOMP/DISCECTOMY FUSION  2014   C4-7   BLADDER INSTILLATION N/A 01/02/2024   Procedure: INSTILLATION, BLADDER;  Surgeon: Georganne Penne SAUNDERS, MD;  Location: [facility] ORS;  Service: Urology;  Laterality: N/A;  Gemcitabine    COLONOSCOPY N/A 09/2013   repeat due 09/2018   COLONOSCOPY N/A 12/19/2023   Procedure: COLONOSCOPY;  Surgeon: Jinny Carmine, MD;  Location: [facility] ENDOSCOPY;  Service: Endoscopy;  Laterality: N/A;   COLONOSCOPY WITH PROPOFOL  N/A 10/29/2018   Procedure: COLONOSCOPY WITH BIOPSY;  Surgeon: Jinny Carmine, MD;  Location: [facility]GERY [facility];  Service: Endoscopy;  Laterality: N/A;  DIABETIC doesnt want arrival time earlier than 0730   CYSTOSCOPY W/ URETERAL STENT PLACEMENT Left 01/02/2024   Procedure: CYSTOSCOPY, WITH RETROGRADE PYELOGRAM AND URETERAL STENT INSERTION;  Surgeon: Georganne Penne SAUNDERS, MD;  Location: [facility] ORS;  Service: Urology;  Laterality: Left;   CYSTOSCOPY W/ URETERAL STENT REMOVAL Left 02/06/2024   Procedure: REMOVAL, STENT, URETER, CYSTOSCOPIC;  Surgeon: Georganne Penne SAUNDERS, MD;  Location: [facility] ORS;  Service: Urology;  Laterality: Left;   CYSTOSCOPY WITH RETROGRADE PYELOGRAM, URETEROSCOPY AND STENT PLACEMENT Left 02/06/2024   Procedure: CYSTOURETEROSCOPY, WITH RETROGRADE PYELOGRAM;  Surgeon: Georganne Penne SAUNDERS, MD;  Location: [facility] ORS;  Service: Urology;  Laterality: Left;   ESOPHAGEAL DILATION N/A 10/29/2018   Procedure: ESOPHAGEAL DILATION;  Surgeon: Jinny Carmine, MD;  Location: [facility] SURGERY [facility]TR;  Service: Endoscopy;  Laterality: N/A;   ESOPHAGOGASTRODUODENOSCOPY N/A 09/2013   ESOPHAGOGASTRODUODENOSCOPY (EGD) WITH PROPOFOL  N/A 10/29/2018   Procedure: ESOPHAGOGASTRODUODENOSCOPY (EGD) WITH DILATION;  Surgeon: Jinny Carmine, MD;  Location: [facility];  Service: Endoscopy;  Laterality: N/A;   POLYPECTOMY N/A 10/29/2018   Procedure:  POLYPECTOMY;  Surgeon: Jinny Carmine, MD;  Location: [facility] SURGERY [facility]TR;  Service: Endoscopy;  Laterality: N/A;   POLYPECTOMY  12/19/2023   Procedure: POLYPECTOMY, INTESTINE;  Surgeon: Jinny Carmine, MD;  Location: [facility] ENDOSCOPY;  Service: Endoscopy;;   TRANSURETHRAL RESECTION OF BLADDER TUMOR N/A 01/02/2024   Procedure: TURBT (TRANSURETHRAL RESECTION OF BLADDER TUMOR);  Surgeon: Georganne Penne SAUNDERS, MD;  Location: [facility] ORS;  Service: Urology;  Laterality: N/A;   TRANSURETHRAL RESECTION OF BLADDER TUMOR N/A 02/06/2024   Procedure: TURBT (TRANSURETHRAL RESECTION OF BLADDER TUMOR);  Surgeon: Georganne Penne SAUNDERS, MD;  Location: [facility] ORS;  Service: Urology;  Laterality: N/A;    Social History   Socioeconomic History   Marital status: Divorced    Spouse name: Not on file   Number of children: Not on file   Years of education: Not on file   Highest education level: Not on file  Occupational History   Not on file  Tobacco Use   Smoking status: Former    Current packs/day: 0.00    Average packs/day: 1 pack/day for 40.0 years (40.0 ttl pk-yrs)    Types: Cigarettes    Start date: 21    Quit date: 2009    Years since quitting: 17.1   Smokeless tobacco: Former    Types: Chew    Quit date: 02/15/2024  Vaping Use   Vaping status: Never Used  Substance and Sexual Activity   Alcohol use: Not Currently    Comment: rarely   Drug use: Never   Sexual activity: Not Currently    Birth control/protection: None  Other Topics Concern   Not on file  Social History  Narrative   Not on file   Social Drivers of Health   Tobacco Use: Medium Risk (04/11/2024)   Received from [facility]   Patient History    Smoking Tobacco Use: Former    Smokeless Tobacco Use: Former    Passive Exposure: Not on Actuary Strain: Low Risk (05/18/2023)   Received from [facility]   Overall Financial Resource Strain (CARDIA)    Difficulty of Paying Living Expenses: Not hard at all   Food Insecurity: No Food Insecurity (02/23/2024)   Epic    Worried About Programme Researcher, Broadcasting/film/video in the Last Year: Never true    Ran Out of Food in the Last Year: Never true  Transportation Needs: No Transportation Needs (02/23/2024)   Epic    Lack of Transportation (Medical): No    Lack of Transportation (Non-Medical): No  Physical Activity: Insufficiently Active (01/17/2024)   Received from [facility]   Exercise Vital Sign    On average, how many days per week do you engage in moderate to strenuous exercise (like a brisk walk)?: 3 days    On average, how many minutes do you engage in exercise at this level?: 30 min  Stress: No Stress Concern Present (01/17/2024)   Received from [facility] - Occupational Stress Questionnaire    Do you feel stress - tense, restless, nervous, or anxious, or unable to sleep at night because your mind is troubled all the time - these days?: Not at all  Social Connections: Not on file  Intimate Partner Violence: Not At Risk (02/23/2024)   Epic    Fear of Current or Ex-Partner: No    Emotionally Abused: No    Physically Abused: No    Sexually Abused: No  Depression (PHQ2-9): Low Risk (04/16/2024)   Depression (PHQ2-9)    PHQ-2 Score: 0  Alcohol Screen: Not on file  Housing: Low Risk (02/23/2024)   Epic    Unable to Pay for Housing in the Last Year: No    Number of Times Moved in the Last Year: 0    Homeless in the Last Year: No  Utilities: Not At Risk (02/23/2024)   Epic    Threatened with loss of utilities: No  Health Literacy: Medium Risk (05/18/2023)   Received from [facility] Literacy    How often do you need to have someone help you when you read instructions, pamphlets, or other written material from your doctor or pharmacy?: Rarely    Family History  Problem Relation Age of Onset   Cancer Father     Current Medications[2]  Physical exam:  Vitals:   04/16/24 1149 04/16/24  1207  BP: (!) 161/77 139/81  Pulse: 65 65  Resp: 19   Temp: 97.8 F (36.6 C)   TempSrc: Tympanic   SpO2: 99%   Weight: 203 lb 6.4 oz (92.3 kg)   Height: 5' 10 (1.778 m)    Physical Exam Cardiovascular:     Rate and Rhythm: Normal rate and regular rhythm.     Heart sounds: Normal heart sounds.  Pulmonary:     Effort: Pulmonary effort is normal.     Breath sounds: Normal breath sounds.  Skin:    General: Skin is warm and dry.  Neurological:     Mental Status: He is alert and oriented to person, place, and time.      I have personally  reviewed labs listed below:    Latest Ref Rng & Units 04/16/2024   11:00 AM  CMP  Glucose 70 - 99 mg/dL 747   BUN 8 - 23 mg/dL 23   Creatinine 9.38 - 1.24 mg/dL 8.64   Sodium 864 - 854 mmol/L 138   Potassium 3.5 - 5.1 mmol/L 4.2   Chloride 98 - 111 mmol/L 102   CO2 22 - 32 mmol/L 25   Calcium 8.9 - 10.3 mg/dL 9.7   Total Protein 6.5 - 8.1 g/dL 6.8   Total Bilirubin 0.0 - 1.2 mg/dL 1.3   Alkaline Phos 38 - 126 U/L 75   AST 15 - 41 U/L 16   ALT 0 - 44 U/L 20       Latest Ref Rng & Units 04/16/2024   11:01 AM  CBC  WBC 4.0 - 10.5 K/uL 9.6   Hemoglobin 13.0 - 17.0 g/dL 86.7   Hematocrit 60.9 - 52.0 % 38.1   Platelets 150 - 400 K/uL 209       Assessment and plan- Patient is a 74 y.o. male with history of superficial bladder cancer involving the dome of the bladder. He is here for on treatment assessment prior to cycle 5 of intravesical gemcitabine  chemotherapy  Counts okay to proceed with cycle 5 of intravesical gemcitabine  chemotherapy today.  UA is otherwise unremarkable.  He will directly proceed for cycle 6 of treatment next week which would be his last treatment.  He does not require any follow-up with me at this time and will continue to follow-up with Dr. Georganne from urology and for cystoscopy surveillance   Visit Diagnosis 1. Encounter for antineoplastic chemotherapy   2. Malignant neoplasm of dome of urinary bladder (HCC)       Dr. Annah Skene, MD, MPH [facility] [phone] 04/16/2024 3:10 PM                    [1]  Allergies Allergen Reactions   Trazodone Swelling   Amlodipine     Edema   Hydrochlorothiazide Other (See Comments)   Metformin     Abdominal pain   Tamsulosin      angioedema   Codeine Nausea Only  [2]  Current Outpatient Medications:    aspirin 81 MG chewable tablet, Chew by mouth daily., Disp: , Rfl:    atorvastatin (LIPITOR) 40 MG tablet, Take 40 mg by mouth daily., Disp: , Rfl:    glucose blood (CONTOUR NEXT TEST) test strip, by Other route., Disp: , Rfl:    metoprolol succinate (TOPROL-XL) 25 MG 24 hr tablet, Take 25 mg by mouth daily., Disp: , Rfl:    olmesartan (BENICAR) 40 MG tablet, Take 40 mg by mouth daily., Disp: , Rfl:    pantoprazole (PROTONIX) 40 MG tablet, TAKE 1 TABLET BY MOUTH EVERY DAY, Disp: , Rfl:    prochlorperazine  (COMPAZINE ) 10 MG tablet, Take 1 tablet (10 mg total) by mouth every 6 (six) hours as needed for nausea or vomiting., Disp: 30 tablet, Rfl: 1   tamsulosin  (FLOMAX ) 0.4 MG CAPS capsule, Take 1 capsule (0.4 mg total) by mouth daily., Disp: 90 capsule, Rfl: 3  "

## 2024-04-23 ENCOUNTER — Inpatient Hospital Stay

## 2024-05-15 ENCOUNTER — Other Ambulatory Visit: Admitting: Urology
